# Patient Record
Sex: Female | Born: 1965 | Race: White | Hispanic: No | Marital: Married | Smoking: Former smoker
Health system: Southern US, Community
[De-identification: ages and names within clinical notes are randomized; demographics above are authoritative.]

## PROBLEM LIST (undated history)

## (undated) DIAGNOSIS — M719 Bursopathy, unspecified: Secondary | ICD-10-CM

## (undated) DIAGNOSIS — J45909 Unspecified asthma, uncomplicated: Secondary | ICD-10-CM

## (undated) DIAGNOSIS — E119 Type 2 diabetes mellitus without complications: Secondary | ICD-10-CM

## (undated) HISTORY — PX: OOPHORECTOMY: SHX86

## (undated) HISTORY — PX: KNEE ARTHROSCOPY: SUR90

---

## 2007-04-23 ENCOUNTER — Emergency Department: Payer: Self-pay | Admitting: Emergency Medicine

## 2007-06-07 ENCOUNTER — Emergency Department: Payer: Self-pay | Admitting: Emergency Medicine

## 2007-10-11 ENCOUNTER — Ambulatory Visit: Payer: Self-pay | Admitting: Internal Medicine

## 2008-03-15 ENCOUNTER — Ambulatory Visit: Payer: Self-pay | Admitting: Internal Medicine

## 2008-03-17 ENCOUNTER — Other Ambulatory Visit: Payer: Self-pay

## 2008-03-17 ENCOUNTER — Emergency Department: Payer: Self-pay | Admitting: Emergency Medicine

## 2008-04-04 ENCOUNTER — Ambulatory Visit: Payer: Self-pay | Admitting: Unknown Physician Specialty

## 2008-04-13 ENCOUNTER — Ambulatory Visit: Payer: Self-pay | Admitting: Unknown Physician Specialty

## 2008-04-14 ENCOUNTER — Other Ambulatory Visit: Payer: Self-pay

## 2008-04-15 ENCOUNTER — Observation Stay: Payer: Self-pay | Admitting: Unknown Physician Specialty

## 2008-09-04 ENCOUNTER — Emergency Department: Payer: Self-pay | Admitting: Unknown Physician Specialty

## 2008-09-26 ENCOUNTER — Ambulatory Visit: Payer: Self-pay | Admitting: Specialist

## 2008-11-28 ENCOUNTER — Ambulatory Visit: Payer: Self-pay | Admitting: Specialist

## 2008-12-05 ENCOUNTER — Ambulatory Visit: Payer: Self-pay | Admitting: Specialist

## 2008-12-19 ENCOUNTER — Ambulatory Visit: Payer: Self-pay | Admitting: Internal Medicine

## 2009-02-13 ENCOUNTER — Ambulatory Visit: Payer: Self-pay | Admitting: Family Medicine

## 2009-03-01 ENCOUNTER — Emergency Department: Payer: Self-pay | Admitting: Emergency Medicine

## 2009-05-28 ENCOUNTER — Ambulatory Visit: Payer: Self-pay | Admitting: Unknown Physician Specialty

## 2009-06-14 ENCOUNTER — Emergency Department: Payer: Self-pay | Admitting: Unknown Physician Specialty

## 2009-11-27 ENCOUNTER — Emergency Department: Payer: Self-pay | Admitting: Internal Medicine

## 2010-01-06 ENCOUNTER — Emergency Department: Payer: Self-pay | Admitting: Unknown Physician Specialty

## 2010-11-17 HISTORY — PX: OTHER SURGICAL HISTORY: SHX169

## 2011-01-14 ENCOUNTER — Emergency Department: Payer: Self-pay | Admitting: Emergency Medicine

## 2011-08-06 ENCOUNTER — Emergency Department: Payer: Self-pay | Admitting: Emergency Medicine

## 2012-03-26 ENCOUNTER — Emergency Department: Payer: Self-pay | Admitting: Emergency Medicine

## 2012-03-26 LAB — BASIC METABOLIC PANEL
Anion Gap: 6 — ABNORMAL LOW (ref 7–16)
Calcium, Total: 9.2 mg/dL (ref 8.5–10.1)
Co2: 26 mmol/L (ref 21–32)
Creatinine: 0.83 mg/dL (ref 0.60–1.30)
EGFR (African American): >60
EGFR (Non-African Amer.): >60
Osmolality: 276 (ref 275–301)
Potassium: 4.1 mmol/L (ref 3.5–5.1)
Sodium: 138 mmol/L (ref 136–145)

## 2012-03-26 LAB — URINALYSIS, COMPLETE
Bilirubin,UR: NEGATIVE
Glucose,UR: NEGATIVE mg/dL (ref 0–75)
Ph: 5 (ref 4.5–8.0)
Protein: 30
RBC,UR: 10 /HPF (ref 0–5)
Specific Gravity: 1.011 (ref 1.003–1.030)
WBC UR: 176 /HPF (ref 0–5)

## 2012-03-26 LAB — CBC
HGB: 12.1 g/dL (ref 12.0–16.0)
MCH: 27.5 pg (ref 26.0–34.0)
MCHC: 33.3 g/dL (ref 32.0–36.0)
MCV: 83 fL (ref 80–100)
RDW: 14.7 % — ABNORMAL HIGH (ref 11.5–14.5)
WBC: 13.2 10*3/uL — ABNORMAL HIGH (ref 3.6–11.0)

## 2012-03-31 LAB — URINE CULTURE

## 2013-05-27 ENCOUNTER — Emergency Department: Payer: Self-pay | Admitting: Emergency Medicine

## 2013-05-27 LAB — CBC
HGB: 12.1 g/dL (ref 12.0–16.0)
MCH: 27.4 pg (ref 26.0–34.0)
MCHC: 32.9 g/dL (ref 32.0–36.0)
MCV: 83 fL (ref 80–100)
RDW: 14.3 % (ref 11.5–14.5)
WBC: 12.7 10*3/uL — ABNORMAL HIGH (ref 3.6–11.0)

## 2013-05-27 LAB — COMPREHENSIVE METABOLIC PANEL
Albumin: 3.6 g/dL (ref 3.4–5.0)
Alkaline Phosphatase: 80 U/L (ref 50–136)
Anion Gap: 4 — ABNORMAL LOW (ref 7–16)
Calcium, Total: 9.3 mg/dL (ref 8.5–10.1)
EGFR (African American): >60
EGFR (Non-African Amer.): >60
Glucose: 107 mg/dL — ABNORMAL HIGH (ref 65–99)
Osmolality: 280 (ref 275–301)
Potassium: 3.3 mmol/L — ABNORMAL LOW (ref 3.5–5.1)
SGOT(AST): 18 U/L (ref 15–37)
SGPT (ALT): 19 U/L (ref 12–78)
Sodium: 140 mmol/L (ref 136–145)
Total Protein: 7.9 g/dL (ref 6.4–8.2)

## 2013-05-27 LAB — TROPONIN I: Troponin-I: <0.02 ng/mL

## 2014-10-27 ENCOUNTER — Emergency Department: Payer: Self-pay | Admitting: Emergency Medicine

## 2014-11-29 ENCOUNTER — Emergency Department: Payer: Self-pay | Admitting: Emergency Medicine

## 2014-11-29 LAB — CBC
HCT: 40.9 % (ref 35.0–47.0)
HGB: 13.1 g/dL (ref 12.0–16.0)
MCH: 26.7 pg (ref 26.0–34.0)
MCHC: 31.9 g/dL — AB (ref 32.0–36.0)
MCV: 84 fL (ref 80–100)
PLATELETS: 310 10*3/uL (ref 150–440)
RBC: 4.89 10*6/uL (ref 3.80–5.20)
RDW: 13.6 % (ref 11.5–14.5)
WBC: 16.3 10*3/uL — ABNORMAL HIGH (ref 3.6–11.0)

## 2014-11-29 LAB — URINALYSIS, COMPLETE
Bilirubin,UR: NEGATIVE
Glucose,UR: NEGATIVE mg/dL (ref 0–75)
Ketone: NEGATIVE
LEUKOCYTE ESTERASE: NEGATIVE
NITRITE: NEGATIVE
PH: 5 (ref 4.5–8.0)
PROTEIN: NEGATIVE
Specific Gravity: 1.033 (ref 1.003–1.030)
Squamous Epithelial: 1
WBC UR: 3 /HPF (ref 0–5)

## 2014-11-29 LAB — COMPREHENSIVE METABOLIC PANEL
ALT: 25 U/L
ANION GAP: 7 (ref 7–16)
Albumin: 3.8 g/dL (ref 3.4–5.0)
Alkaline Phosphatase: 74 U/L
BUN: 20 mg/dL — ABNORMAL HIGH (ref 7–18)
Bilirubin,Total: 0.5 mg/dL (ref 0.2–1.0)
CALCIUM: 9.1 mg/dL (ref 8.5–10.1)
CHLORIDE: 104 mmol/L (ref 98–107)
CO2: 27 mmol/L (ref 21–32)
Creatinine: 1.03 mg/dL (ref 0.60–1.30)
EGFR (African American): >60
EGFR (Non-African Amer.): >60
Glucose: 134 mg/dL — ABNORMAL HIGH (ref 65–99)
Osmolality: 280 (ref 275–301)
Potassium: 4 mmol/L (ref 3.5–5.1)
SGOT(AST): 19 U/L (ref 15–37)
Sodium: 138 mmol/L (ref 136–145)
Total Protein: 8.6 g/dL — ABNORMAL HIGH (ref 6.4–8.2)

## 2015-03-30 ENCOUNTER — Emergency Department: Payer: BLUE CROSS/BLUE SHIELD

## 2015-03-30 ENCOUNTER — Emergency Department
Admission: EM | Admit: 2015-03-30 | Discharge: 2015-03-30 | Disposition: A | Discharge status: Home / Self Care | Payer: BLUE CROSS/BLUE SHIELD | Attending: Emergency Medicine | Admitting: Emergency Medicine

## 2015-03-30 ENCOUNTER — Encounter: Payer: Self-pay | Admitting: Emergency Medicine

## 2015-03-30 DIAGNOSIS — M25562 Pain in left knee: Secondary | ICD-10-CM | POA: Diagnosis not present

## 2015-03-30 HISTORY — DX: Unspecified asthma, uncomplicated: J45.909

## 2015-03-30 MED ORDER — OXYCODONE-ACETAMINOPHEN 5-325 MG PO TABS
1.0000 | ORAL_TABLET | Freq: Once | ORAL | Status: AC
  Administered 2015-03-30: 1 via ORAL

## 2015-03-30 MED ORDER — IBUPROFEN 800 MG PO TABS: 800.0000 mg | ORAL_TABLET | Freq: Three times a day (TID) | ORAL | Status: DC | PRN

## 2015-03-30 MED ORDER — OXYCODONE-ACETAMINOPHEN 5-325 MG PO TABS
ORAL_TABLET | ORAL | Status: AC
  Administered 2015-03-30: 1 via ORAL
  Filled 2015-03-30: qty 1

## 2015-03-30 MED ORDER — OXYCODONE-ACETAMINOPHEN 5-325 MG PO TABS: 1.0000 | ORAL_TABLET | Freq: Three times a day (TID) | ORAL | Status: DC | PRN

## 2015-03-30 NOTE — ED Notes (Signed)
Patient c/o left knee and left thigh pain for several days

## 2015-03-30 NOTE — Discharge Instructions (Signed)
Take medications as prescribed. Rest knee. Apply ice. Use knee immobilizer and crutches as long as pain continues.  Follow-up with orthopedic next week as discussed. See above to call and schedule.  Return to the ER for new or worsening concerns  Knee Pain The knee is the complex joint between your thigh and your lower leg. It is made up of bones, tendons, ligaments, and cartilage. The bones that make up the knee are:  The femur in the thigh.  The tibia and fibula in the lower leg.  The patella or kneecap riding in the groove on the lower femur. CAUSES  Knee pain is a common complaint with many causes. A few of these causes are:  Injury, such as:  A ruptured ligament or tendon injury.  Torn cartilage.  Medical conditions, such as:  Gout  Arthritis  Infections  Overuse, over training, or overdoing a physical activity. Knee pain can be minor or severe. Knee pain can accompany debilitating injury. Minor knee problems often respond well to self-care measures or get well on their own. More serious injuries may need medical intervention or even surgery. SYMPTOMS The knee is complex. Symptoms of knee problems can vary widely. Some of the problems are:  Pain with movement and weight bearing.  Swelling and tenderness.  Buckling of the knee.  Inability to straighten or extend your knee.  Your knee locks and you cannot straighten it.  Warmth and redness with pain and fever.  Deformity or dislocation of the kneecap. DIAGNOSIS  Determining what is wrong may be very straight forward such as when there is an injury. It can also be challenging because of the complexity of the knee. Tests to make a diagnosis may include:  Your caregiver taking a history and doing a physical exam.  Routine X-rays can be used to rule out other problems. X-rays will not reveal a cartilage tear. Some injuries of the knee can be diagnosed by:  Arthroscopy a surgical technique by which a small video  camera is inserted through tiny incisions on the sides of the knee. This procedure is used to examine and repair internal knee joint problems. Tiny instruments can be used during arthroscopy to repair the torn knee cartilage (meniscus).  Arthrography is a radiology technique. A contrast liquid is directly injected into the knee joint. Internal structures of the knee joint then become visible on X-ray film.  An MRI scan is a non X-ray radiology procedure in which magnetic fields and a computer produce two- or three-dimensional images of the inside of the knee. Cartilage tears are often visible using an MRI scanner. MRI scans have largely replaced arthrography in diagnosing cartilage tears of the knee.  Blood work.  Examination of the fluid that helps to lubricate the knee joint (synovial fluid). This is done by taking a sample out using a needle and a syringe. TREATMENT The treatment of knee problems depends on the cause. Some of these treatments are:  Depending on the injury, proper casting, splinting, surgery, or physical therapy care will be needed.  Give yourself adequate recovery time. Do not overuse your joints. If you begin to get sore during workout routines, back off. Slow down or do fewer repetitions.  For repetitive activities such as cycling or running, maintain your strength and nutrition.  Alternate muscle groups. For example, if you are a weight lifter, work the upper body on one day and the lower body the next.  Either tight or weak muscles do not give the proper  support for your knee. Tight or weak muscles do not absorb the stress placed on the knee joint. Keep the muscles surrounding the knee strong.  Take care of mechanical problems.  If you have flat feet, orthotics or special shoes may help. See your caregiver if you need help.  Arch supports, sometimes with wedges on the inner or outer aspect of the heel, can help. These can shift pressure away from the side of the knee  most bothered by osteoarthritis.  A brace called an "unloader" brace also may be used to help ease the pressure on the most arthritic side of the knee.  If your caregiver has prescribed crutches, braces, wraps or ice, use as directed. The acronym for this is PRICE. This means protection, rest, ice, compression, and elevation.  Nonsteroidal anti-inflammatory drugs (NSAIDs), can help relieve pain. But if taken immediately after an injury, they may actually increase swelling. Take NSAIDs with food in your stomach. Stop them if you develop stomach problems. Do not take these if you have a history of ulcers, stomach pain, or bleeding from the bowel. Do not take without your caregiver's approval if you have problems with fluid retention, heart failure, or kidney problems.  For ongoing knee problems, physical therapy may be helpful.  Glucosamine and chondroitin are over-the-counter dietary supplements. Both may help relieve the pain of osteoarthritis in the knee. These medicines are different from the usual anti-inflammatory drugs. Glucosamine may decrease the rate of cartilage destruction.  Injections of a corticosteroid drug into your knee joint may help reduce the symptoms of an arthritis flare-up. They may provide pain relief that lasts a few months. You may have to wait a few months between injections. The injections do have a small increased risk of infection, water retention, and elevated blood sugar levels.  Hyaluronic acid injected into damaged joints may ease pain and provide lubrication. These injections may work by reducing inflammation. A series of shots may give relief for as long as 6 months.  Topical painkillers. Applying certain ointments to your skin may help relieve the pain and stiffness of osteoarthritis. Ask your pharmacist for suggestions. Many over the-counter products are approved for temporary relief of arthritis pain.  In some countries, doctors often prescribe topical NSAIDs  for relief of chronic conditions such as arthritis and tendinitis. A review of treatment with NSAID creams found that they worked as well as oral medications but without the serious side effects. PREVENTION  Maintain a healthy weight. Extra pounds put more strain on your joints.  Get strong, stay limber. Weak muscles are a common cause of knee injuries. Stretching is important. Include flexibility exercises in your workouts.  Be smart about exercise. If you have osteoarthritis, chronic knee pain or recurring injuries, you may need to change the way you exercise. This does not mean you have to stop being active. If your knees ache after jogging or playing basketball, consider switching to swimming, water aerobics, or other low-impact activities, at least for a few days a week. Sometimes limiting high-impact activities will provide relief.  Make sure your shoes fit well. Choose footwear that is right for your sport.  Protect your knees. Use the proper gear for knee-sensitive activities. Use kneepads when playing volleyball or laying carpet. Buckle your seat belt every time you drive. Most shattered kneecaps occur in car accidents.  Rest when you are tired. SEEK MEDICAL CARE IF:  You have knee pain that is continual and does not seem to be getting better.  SEEK IMMEDIATE MEDICAL CARE IF:  Your knee joint feels hot to the touch and you have a high fever. MAKE SURE YOU:   Understand these instructions.  Will watch your condition.  Will get help right away if you are not doing well or get worse. Document Released: 08/31/2007 Document Revised: 01/26/2012 Document Reviewed: 08/31/2007 [facility] Patient Information 2015 [place], [place]. This information is not intended to replace advice given to you by your health care provider. Make sure you discuss any questions you have with your health care provider.

## 2015-03-30 NOTE — ED Provider Notes (Signed)
[facility] Emergency Department Provider Note  ____________________________________________  Time seen: Approximately 11:36 AM  I have reviewed the triage vital signs and the nursing notes.   HISTORY  Chief Complaint Leg Pain   HPI Haley Keller is a 49 y.o. female presents to the ER with spouse at bedside. Patient presents to complaints of left leg pain. Patient states that she has had intermittent left knee pain and swelling since 2010. Patient states at that time she had outpatient knee arthroscopy but was told only had arthritis. Patient states that her pain intermittently "flares up". However patient reports times approximately 4-5 days has had increased pain not resolving with over-the-counter ibuprofen. Patient states the pain is also intermittently in calf and upper thigh which is not her normal pain.  Patient describes the pain at 4 out of 10 at rest and 7-8 out of 10 with movement or ambulating. Describes pain as aching with intermittent sharp pain.   Denies fall or known injury. Denies fever, redness, shortness of breath, chest pain, abdominal pain, dysuria, or other pain.   Past Medical History  Diagnosis Date  . Asthma     There are no active problems to display for this patient.   Past Surgical History  Procedure Laterality Date  . Oophorectomy Left   . Knee arthroscopy Left     Home medications: Albuterol inhaler as needed Allergies Review of patient's allergies indicates no known allergies.  No family history on file.  Social History History  Substance Use Topics  . Smoking status: Never Smoker   . Smokeless tobacco: Not on file  . Alcohol Use: No    Review of Systems Constitutional: No fever/chills Eyes: No visual changes. ENT: No sore throat. Cardiovascular: Denies chest pain. Respiratory: Denies shortness of breath. Gastrointestinal: No abdominal pain.  No nausea, no vomiting.  No diarrhea.  No  constipation. Genitourinary: Negative for dysuria. Musculoskeletal: Positive for left knee pain as above. Negative for neck or back pain. Skin: Negative for rash. Neurological: Negative for headaches, focal weakness or numbness.  10-point ROS otherwise negative.  ____________________________________________   PHYSICAL EXAM:  VITAL SIGNS: ED Triage Vitals  Enc Vitals Group     BP 03/30/15 1008 159/71 mmHg     Pulse Rate 03/30/15 1008 57     Resp 03/30/15 1008 15     Temp 03/30/15 1008 97.8 F (36.6 C)     Temp Source 03/30/15 1008 Oral     SpO2 03/30/15 1008 99 %     Weight 03/30/15 1008 250 lb (113.399 kg)     Height 03/30/15 1008 5\' 2"  (1.575 m)     Head Cir --      Peak Flow --      Pain Score 03/30/15 1007 7     Pain Loc --      Pain Edu? --      Excl. in GC? --     Constitutional: Alert and oriented. Well appearing and in no acute distress. Eyes: Conjunctivae are normal.  Head: Atraumatic. Nose: No congestion/rhinnorhea. Mouth/Throat: Mucous membranes are moist.   Neck: No stridor.  No cervical spine tenderness to palpation. Hematological/Lymphatic/Immunilogical: No cervical lymphadenopathy. Cardiovascular: Normal rate, regular rhythm. Grossly normal heart sounds.  Good peripheral circulation. Respiratory: Normal respiratory effort.  No retractions. Lungs CTAB. Gastrointestinal: Soft and nontender. Obese abdomen Musculoskeletal: No lower extremity tenderness nor edema.  No joint effusions.' Except: Left anterior and left lateral knee moderate tender to palpation. Pain increases with posterior  drawer test and pain with medial stress. Mild swelling to knee laterally. Skin intact, no erythema, ecchymosis. Full ROM present.  Mild tender to palpation left posterior calf, mild tender to palpation left posterior thigh soft tissue. Negative Homans sign.. Distal pedal pulses equal bilaterally and easily found. No distal swelling to bilateral lower extremities. No signs of  ischemia. No erythema. Neurologic:  Normal speech and language. No gross focal neurologic deficits are appreciated. Speech is normal. No gait instability. Skin:  Skin is warm, dry and intact. No rash noted. Psychiatric: Mood and affect are normal. Speech and behavior are normal.  ____________________________________________   ___________________________________  RADIOLOGY  LEFT LOWER EXTREMITY VENOUS DUPLEX ULTRASOUND  TECHNIQUE: Gray-scale sonography with graded compression, as well as color Doppler and duplex ultrasound were performed to evaluate the left lower extremity deep venous system from the level of the common femoral vein and including the common femoral, femoral, profunda femoral, popliteal and calf veins including the posterior tibial, peroneal and gastrocnemius veins when visible. The superficial great saphenous vein was also interrogated. Spectral Doppler was utilized to evaluate flow at rest and with distal augmentation maneuvers in the common femoral, femoral and popliteal veins.  COMPARISON: None.  FINDINGS: Contralateral Common Femoral Vein: Respiratory phasicity is normal and symmetric with the symptomatic side. No evidence of thrombus. Normal compressibility.  Common Femoral Vein: No evidence of thrombus. Normal compressibility, respiratory phasicity and response to augmentation.  Saphenofemoral Junction: No evidence of thrombus. Normal compressibility and flow on color Doppler imaging.  Profunda Femoral Vein: No evidence of thrombus. Normal compressibility and flow on color Doppler imaging.  Femoral Vein: No evidence of thrombus. Normal compressibility, respiratory phasicity and response to augmentation.  Popliteal Vein: No evidence of thrombus. Normal compressibility, respiratory phasicity and response to augmentation.  Calf Veins: No evidence of thrombus. Normal compressibility and flow on color Doppler imaging.  Superficial Great Saphenous  Vein: No evidence of thrombus. Normal compressibility and flow on color Doppler imaging.  Venous Reflux: None.  Other Findings: None.  IMPRESSION: No evidence of left lower extremity deep venous thrombosis. The right common femoral vein is also patent.   Electronically Signed By: Bretta BangWilliam Woodruff III M.D. On: 03/30/2015 12:50  LEFT KNEE - COMPLETE 4+ VIEW  COMPARISON: None.  FINDINGS: There is no evidence of fracture, dislocation, or joint effusion. There is no evidence of arthropathy or other focal bone abnormality. Soft tissues are unremarkable.  IMPRESSION: Negative left knee radiographs.   Electronically Signed By: Marin Robertshristopher Mattern M.D. On: 03/30/2015 12:45 ____________________________________________   PROCEDURES  Procedure(s) performed: SPLINT APPLICATION Date/Time: 2:13 PM Authorized by: Renford DillsLindsey Farrin Shadle Consent: Verbal consent obtained. Risks and benefits: risks, benefits and alternatives were discussed Consent given by: patient Splint applied by: ed technician Location details: left knee  Splint type: soft immobilizer  Post-procedure: The splinted body part was neurovascularly unchanged following the procedure. Patient tolerance: Patient tolerated the procedure well with no immediate complications.  crutches ____________________________________________   INITIAL IMPRESSION / ASSESSMENT AND PLAN / ED COURSE  Pertinent labs & imaging results that were available during my care of the patient were reviewed by me and considered in my medical decision making (see chart for details).  Well-appearing. No acute distress. Complains of left lateral knee pain. History of similar. Denies new injury or fall. However reports pain worse times several days. X-ray negative for acute changes. Venous ultrasound negative for deep venous thrombosis. Concern for internal injury.  Discussed with patient and spouse need to follow up with orthopedics For  further evaluation. Left knee immobilizer, crutches, rest, apply ice. When necessary ibuprofen and Percocet. Patient to follow-up with orthopedics. Patient and spouse agree to plan.   ____________________________________________   FINAL CLINICAL IMPRESSION(S) / ED DIAGNOSES  Final diagnoses:  Left knee pain      Renford Dills, NP 03/30/15 1415  Minna Antis, MD 04/01/15 1213

## 2015-06-02 ENCOUNTER — Encounter: Payer: Self-pay | Admitting: Emergency Medicine

## 2015-06-02 ENCOUNTER — Emergency Department
Admission: EM | Admit: 2015-06-02 | Discharge: 2015-06-02 | Disposition: A | Discharge status: Home / Self Care | Payer: BLUE CROSS/BLUE SHIELD | Attending: Emergency Medicine | Admitting: Emergency Medicine

## 2015-06-02 ENCOUNTER — Emergency Department: Payer: BLUE CROSS/BLUE SHIELD

## 2015-06-02 DIAGNOSIS — Z87891 Personal history of nicotine dependence: Secondary | ICD-10-CM | POA: Insufficient documentation

## 2015-06-02 DIAGNOSIS — M62830 Muscle spasm of back: Secondary | ICD-10-CM | POA: Diagnosis not present

## 2015-06-02 DIAGNOSIS — M545 Low back pain: Secondary | ICD-10-CM | POA: Diagnosis present

## 2015-06-02 MED ORDER — KETOROLAC TROMETHAMINE 30 MG/ML IJ SOLN
30.0000 mg | Freq: Once | INTRAMUSCULAR | Status: AC
  Administered 2015-06-02: 30 mg via INTRAMUSCULAR
  Filled 2015-06-02: qty 1

## 2015-06-02 MED ORDER — IBUPROFEN 800 MG PO TABS: 800.0000 mg | ORAL_TABLET | Freq: Three times a day (TID) | ORAL | Status: DC | PRN

## 2015-06-02 MED ORDER — CYCLOBENZAPRINE HCL 10 MG PO TABS: 10.0000 mg | ORAL_TABLET | Freq: Every day | ORAL | Status: DC

## 2015-06-02 MED ORDER — ORPHENADRINE CITRATE 30 MG/ML IJ SOLN
60.0000 mg | Freq: Once | INTRAMUSCULAR | Status: AC
  Administered 2015-06-02: 60 mg via INTRAMUSCULAR
  Filled 2015-06-02: qty 2

## 2015-06-02 NOTE — ED Provider Notes (Signed)
CSN: [id number]     Arrival date & time 06/02/15  0808 History   First MD Initiated Contact with Patient 06/02/15 [phone]     Chief Complaint  Patient presents with  . Back Pain    HPI Comments: 49 year old female presents today complaining of mid back pain that started when she woke up this morning. For the past couple weeks she has had some pain in her lower back radiating down her right leg. It is associated with tingling and numbness in her right leg. Denies loss of bowel or bladder function, perianal or genital numbness. She takes Motrin at times without relief. She does not have a history of back problems. Denies any chest or abdominal pain associated with her symptoms. No dysuria, nausea or vomiting.  Patient is a 49 y.o. female presenting with back pain. The history is provided by the patient.  Back Pain Location:  Thoracic spine Quality:  Stabbing Radiates to:  Does not radiate Pain severity:  Severe Pain is:  Same all the time Onset quality:  Sudden Duration:  2 hours Timing:  Constant Progression:  Unchanged Chronicity:  Recurrent Context: not jumping from heights, not lifting heavy objects, not occupational injury, not physical stress and not recent injury   Relieved by:  None tried Worsened by:  Ambulation, deep breathing, movement, standing, palpation and lying down Ineffective treatments:  None tried Associated symptoms: dysuria, leg pain, numbness and paresthesias   Associated symptoms: no abdominal pain, no bladder incontinence, no bowel incontinence, no chest pain, no fever, no headaches, no perianal numbness, no tingling and no weakness   Risk factors: lack of exercise and obesity     Past Medical History  Diagnosis Date  . Asthma    Past Surgical History  Procedure Laterality Date  . Oophorectomy Left   . Knee arthroscopy Left   . Right wristy surgery  2012   No family history on file. History  Substance Use Topics  . Smoking status: Former Games developer  .  Smokeless tobacco: Never Used  . Alcohol Use: No   OB History    No data available     Review of Systems  Constitutional: Negative for fever and chills.  Cardiovascular: Negative for chest pain.  Gastrointestinal: Negative for nausea, vomiting, abdominal pain and bowel incontinence.  Genitourinary: Positive for dysuria. Negative for bladder incontinence.  Musculoskeletal: Positive for myalgias, back pain, arthralgias and neck pain. Negative for gait problem and neck stiffness.  Neurological: Positive for numbness and paresthesias. Negative for tingling, weakness and headaches.  All other systems reviewed and are negative.     Allergies  Review of patient's allergies indicates no known allergies.  Home Medications   Prior to Admission medications   Medication Sig Start Date End Date Taking? Authorizing Provider  cyclobenzaprine (FLEXERIL) 10 MG tablet Take 1 tablet (10 mg total) by mouth at bedtime. 06/02/15 06/01/16  Luvenia Redden, PA-C  ibuprofen (ADVIL,MOTRIN) 800 MG tablet Take 1 tablet (800 mg total) by mouth every 8 (eight) hours as needed. 06/02/15   Wilber Oliphant V, PA-C   BP 144/62 mmHg  Pulse 74  Temp(Src) 97.6 F (36.4 C) (Oral)  Resp 22  Ht 5\' 2"  (1.575 m)  Wt 253 lb (114.76 kg)  BMI 46.26 kg/m2  SpO2 98% Physical Exam  Constitutional: She is oriented to person, place, and time. Vital signs are normal. She appears well-developed and well-nourished. She does not have a sickly appearance. She does not appear ill. She appears distressed.  Morbidly obese, in mild distress due to pain  HENT:  Head: Normocephalic and atraumatic.  Neck: Normal range of motion and full passive range of motion without pain. Neck supple. Spinous process tenderness and muscular tenderness present. Normal range of motion present.  Diffuse cervical spine tenderness Bilateral trapezius with tenderness to palpation  Cardiovascular: Normal rate, regular rhythm, S1 normal, S2 normal, normal heart  sounds and intact distal pulses.  Exam reveals no gallop.   No murmur heard. Pulses:      Radial pulses are 2+ on the right side, and 2+ on the left side.       Posterior tibial pulses are 2+ on the right side, and 2+ on the left side.  Pulmonary/Chest: Effort normal and breath sounds normal. No respiratory distress. She has no wheezes. She has no rales.  Musculoskeletal: She exhibits tenderness.       Right hip: Normal. She exhibits normal range of motion, normal strength and no tenderness.       Left hip: Normal. She exhibits normal range of motion, normal strength and no tenderness.       Thoracic back: She exhibits tenderness and bony tenderness. She exhibits normal range of motion.       Lumbar back: She exhibits tenderness and bony tenderness. She exhibits normal range of motion.  Diffuse thoracic spine tenderness Patient is tender even to light palpation of bilateral paraspinal thoracic muscles. Spasm noted to both sides Tenderness to lower back and bilateral paraspinal lumbar muscles  Neurological: She is alert and oriented to person, place, and time. She has normal strength. No sensory deficit. She exhibits normal muscle tone. Gait normal.  Strength 5 out of 5 bilateral lower extremities. Grip strength 5 out of 5 bilateral upper extremities Negative straight-leg raise bilaterally  Skin: Skin is warm and dry.  Psychiatric: She has a normal mood and affect. Her behavior is normal. Judgment and thought content normal.  Nursing note and vitals reviewed.   ED Course  Procedures (including critical care time) Labs Review Labs Reviewed - No data to display  Imaging Review Dg Cervical Spine Complete  06/02/2015   CLINICAL DATA:  49 year old female with a history of neck pain  EXAM: CERVICAL SPINE  4+ VIEWS  COMPARISON:  None.  FINDINGS: Cervical Spine:  Cervical elements from the level of the C1-T1 maintain alignment, without subluxation, anterolisthesis, retrolisthesis. Reversal of  cervical lordosis.  No acute fracture line identified.  Unremarkable appearance of the craniocervical junction.  Vertebral body heights maintained as well as disc space heights.  No significant degenerative disc disease or endplate changes.  No significant facet disease.  Prevertebral soft tissues within normal limits.  Oblique images demonstrate no significant neural foraminal encroachment. There is mild foraminal disease on the left and right at C3-C4.  IMPRESSION: No radiographic evidence of acute fracture or malalignment of the cervical spine.  Reversal of cervical lordosis may be positional.  Signed,  Yvone Neu. Loreta Ave, DO  [facility]  [facility]   Electronically Signed   By: Gilmer Mor D.O.   On: 06/02/2015 09:14   Dg Thoracic Spine 2 View  06/02/2015   CLINICAL DATA:  Patient has neck pain and mid back pain that radiates laterally to both sides of her back with no injury. Patient states she was fine yesterday and woke up in pain. Patient is having difficulty moving.  EXAM: THORACIC SPINE - 2-3 VIEWS  COMPARISON:  None.  FINDINGS: No fracture. No spondylolisthesis.  Minor endplate spurring is noted along the mid to lower thoracic spine. Soft tissues are unremarkable.  IMPRESSION: No fracture or acute finding.  Minor disc degenerative change.   Electronically Signed   By: Amie Portlandavid  Ormond M.D.   On: 06/02/2015 09:12     EKG Interpretation None      MDM  I independently reviewed and interpreted the lumbar spine and thoracic spine x-rays -there is no evidence of fracture, scoliosis or significant digenerative change Norflex 60 mg IM, Toradol 30 mg IM in ER Instructed pt to take motrin TID with food, flexeril at night time - drowsiness warning given Moist heat to back and gentle stretching - follow up with PCP or persistent symptoms, return here if worsening  Final diagnoses:  Spasm of back muscles        Luvenia ReddenEmma Weavil V, PA-C 06/02/15  0917  Jene Everyobert Kinner, MD 06/02/15 [phone]

## 2015-06-02 NOTE — ED Notes (Addendum)
Pt reports she woke this morning with middle back pain about where her bra line is.  Denies injury. Increased pain with palpitation and movement.

## 2015-08-04 NOTE — Addendum Note (Signed)
Encounter addended by: Billiejo Sorto Weavil V, PA-C on: 08/04/2015 10:22 AM<BR>     Documentation filed: Letters

## 2015-08-13 ENCOUNTER — Encounter: Payer: Self-pay | Admitting: Emergency Medicine

## 2015-08-13 ENCOUNTER — Emergency Department
Admission: EM | Admit: 2015-08-13 | Discharge: 2015-08-13 | Disposition: A | Discharge status: Home / Self Care | Payer: BLUE CROSS/BLUE SHIELD | Attending: Emergency Medicine | Admitting: Emergency Medicine

## 2015-08-13 DIAGNOSIS — S199XXA Unspecified injury of neck, initial encounter: Secondary | ICD-10-CM | POA: Diagnosis present

## 2015-08-13 DIAGNOSIS — Y939 Activity, unspecified: Secondary | ICD-10-CM | POA: Diagnosis not present

## 2015-08-13 DIAGNOSIS — Z87891 Personal history of nicotine dependence: Secondary | ICD-10-CM | POA: Insufficient documentation

## 2015-08-13 DIAGNOSIS — Y929 Unspecified place or not applicable: Secondary | ICD-10-CM | POA: Diagnosis not present

## 2015-08-13 DIAGNOSIS — X58XXXA Exposure to other specified factors, initial encounter: Secondary | ICD-10-CM | POA: Diagnosis not present

## 2015-08-13 DIAGNOSIS — S161XXA Strain of muscle, fascia and tendon at neck level, initial encounter: Secondary | ICD-10-CM | POA: Insufficient documentation

## 2015-08-13 DIAGNOSIS — Y999 Unspecified external cause status: Secondary | ICD-10-CM | POA: Diagnosis not present

## 2015-08-13 MED ORDER — KETOROLAC TROMETHAMINE 30 MG/ML IJ SOLN
30.0000 mg | Freq: Once | INTRAMUSCULAR | Status: AC
  Administered 2015-08-13: 30 mg via INTRAVENOUS
  Filled 2015-08-13: qty 1

## 2015-08-13 MED ORDER — NAPROXEN 500 MG PO TBEC: 500.0000 mg | DELAYED_RELEASE_TABLET | Freq: Two times a day (BID) | ORAL | Status: DC

## 2015-08-13 MED ORDER — DIAZEPAM 5 MG/ML IJ SOLN
5.0000 mg | Freq: Once | INTRAMUSCULAR | Status: AC
  Administered 2015-08-13: 5 mg via INTRAVENOUS
  Filled 2015-08-13: qty 2

## 2015-08-13 MED ORDER — METHOCARBAMOL 500 MG PO TABS: 500.0000 mg | ORAL_TABLET | Freq: Four times a day (QID) | ORAL | Status: DC | PRN

## 2015-08-13 NOTE — ED Notes (Signed)
Developed neck pain about 5 pm yesterday  Denies any injury or fever

## 2015-08-13 NOTE — Discharge Instructions (Signed)

## 2015-08-13 NOTE — ED Provider Notes (Signed)
[facility] Emergency Department Provider Note  ____________________________________________  Time seen: Approximately 1:35 PM  I have reviewed the triage vital signs and the nursing notes.   HISTORY  Chief Complaint Neck Pain    HPI Haley Keller is a 49 y.o. female since for evaluation of neck pain starting yesterday about 5:00. Patient denies any known injury. States the pain just radiates down both sides of her neck lanes of increased difficulty turning side to side.   Past Medical History  Diagnosis Date  . Asthma     There are no active problems to display for this patient.   Past Surgical History  Procedure Laterality Date  . Oophorectomy Left   . Knee arthroscopy Left   . Right wristy surgery  2012    Current Outpatient Rx  Name  Route  Sig  Dispense  Refill  . methocarbamol (ROBAXIN) 500 MG tablet   Oral   Take 1 tablet (500 mg total) by mouth every 6 (six) hours as needed for muscle spasms.   30 tablet   0   . naproxen (EC NAPROSYN) 500 MG EC tablet   Oral   Take 1 tablet (500 mg total) by mouth 2 (two) times daily with a meal.   60 tablet   0     Allergies Review of patient's allergies indicates no known allergies.  History reviewed. No pertinent family history.  Social History Social History  Substance Use Topics  . Smoking status: Former Games developer  . Smokeless tobacco: Never Used  . Alcohol Use: No    Review of Systems Constitutional: No fever/chills Eyes: No visual changes. ENT: No sore throat. Cardiovascular: Denies chest pain. Respiratory: Denies shortness of breath. Gastrointestinal: No abdominal pain.  No nausea, no vomiting.  No diarrhea.  No constipation. Genitourinary: Negative for dysuria. Musculoskeletal: Positive for neck pain. Skin: Negative for rash. Neurological: Negative for headaches, focal weakness or numbness.  10-point ROS otherwise  negative.  ____________________________________________   PHYSICAL EXAM:  VITAL SIGNS: ED Triage Vitals  Enc Vitals Group     BP 08/13/15 1309 149/61 mmHg     Pulse Rate 08/13/15 1309 65     Resp 08/13/15 1309 20     Temp 08/13/15 1309 98 F (36.7 C)     Temp Source 08/13/15 1309 Oral     SpO2 08/13/15 1309 98 %     Weight 08/13/15 1309 250 lb (113.399 kg)     Height 08/13/15 1309  (1.575 m)     Head Cir --      Peak Flow --      Pain Score 08/13/15 1310 7     Pain Loc --      Pain Edu? --      Excl. in GC? --     Constitutional: Alert and oriented. Well appearing and in no acute distress. Eyes: Conjunctivae are normal. PERRL. EOMI. Head: Atraumatic. Nose: No congestion/rhinnorhea. Mouth/Throat: Mucous membranes are moist.  Oropharynx non-erythematous. Neck: No stridor.  Limited range of motion with flexion extension lateralization. No spinal tenderness. Cardiovascular: Normal rate, regular rhythm. Grossly normal heart sounds.  Good peripheral circulation. Respiratory: Normal respiratory effort.  No retractions. Lungs CTAB. Musculoskeletal: No lower extremity tenderness nor edema.  No joint effusions. Neurologic:  Normal speech and language. No gross focal neurologic deficits are appreciated. No gait instability. Skin:  Skin is warm, dry and intact. No rash noted. Psychiatric: Mood and affect are normal. Speech and behavior are normal.  ____________________________________________   LABS (all labs ordered are listed, but only abnormal results are displayed)  Labs Reviewed - No data to display ____________________________________________   RADIOLOGY   Deferred ____________________________________________   PROCEDURES  Procedure(s) performed: None  Critical Care performed: No  ____________________________________________   INITIAL IMPRESSION / ASSESSMENT AND PLAN / ED COURSE  Pertinent labs & imaging results that were available during my care of  the patient were reviewed by me and considered in my medical decision making (see chart for details).  2 cervical myofascial strain. Rx given for Robaxin and naproxen. Heat therapy and discharge instructions provided work excuse 1 day patient encouraged to follow up with PCP or return to the ER as needed. She voices no other emergency medical complaints at this time ____________________________________________   FINAL CLINICAL IMPRESSION(S) / ED DIAGNOSES  Final diagnoses:  Cervical strain, acute, initial encounter      Evangeline Dakin, PA-C 08/13/15 1534  Sharyn Creamer, MD 08/13/15 [phone]

## 2015-08-13 NOTE — ED Notes (Signed)
Pt to ed with co neck pain that started yesterday, acute onset, denies injury.  Pt with decreased ROM in neck.

## 2017-01-22 ENCOUNTER — Emergency Department
Admission: EM | Admit: 2017-01-22 | Discharge: 2017-01-22 | Disposition: A | Discharge status: Home / Self Care | Payer: BLUE CROSS/BLUE SHIELD | Attending: Emergency Medicine | Admitting: Emergency Medicine

## 2017-01-22 DIAGNOSIS — Z87891 Personal history of nicotine dependence: Secondary | ICD-10-CM | POA: Insufficient documentation

## 2017-01-22 DIAGNOSIS — M7552 Bursitis of left shoulder: Secondary | ICD-10-CM | POA: Diagnosis not present

## 2017-01-22 DIAGNOSIS — J45909 Unspecified asthma, uncomplicated: Secondary | ICD-10-CM | POA: Diagnosis not present

## 2017-01-22 DIAGNOSIS — M542 Cervicalgia: Secondary | ICD-10-CM | POA: Diagnosis present

## 2017-01-22 HISTORY — DX: Bursopathy, unspecified: M71.9

## 2017-01-22 MED ORDER — PREDNISONE 10 MG PO TABS
ORAL_TABLET | ORAL | 0 refills | Status: DC
Start: 2017-01-22 — End: 2017-09-06

## 2017-01-22 MED ORDER — DEXAMETHASONE SODIUM PHOSPHATE 10 MG/ML IJ SOLN
10.0000 mg | Freq: Once | INTRAMUSCULAR | Status: AC
  Administered 2017-01-22: 10 mg via INTRAMUSCULAR
  Filled 2017-01-22: qty 1

## 2017-01-22 NOTE — ED Notes (Signed)
See triage note  States she has had pain to left shoulder for about 3 weeks   Was dx'd with bursitis  Now states pain has increased   Having pain to neck and into shoulder  Limited movement d/t pain

## 2017-01-22 NOTE — ED Provider Notes (Signed)
[facility] Emergency Department Provider Note  ____________________________________________   First MD Initiated Contact with Patient 01/22/17 [id number]     (approximate)  I have reviewed the triage vital signs and the nursing notes.   HISTORY  Chief Complaint Neck Pain and Arm Pain    HPI Haley Keller is a 51 y.o. female  is here with complaint of left shoulder bursitis that was diagnosed 3 weeks ago by her PCP. Patient states that the pain now is into the right side of her neck and continues in her shoulder. She states that she was not placed on any medication for her bursitis. Pain is increased with range of motion. In looking over her records she was diagnosed with bursitis and apparently not placed on any anti-inflammatories because of a history of rectal bleeding.  Blood has been bright red in nature.  She states that she has not seen any blood and has never been diagnosed with a GI bleed. She rates her pain as 9/10.   Past Medical History:  Diagnosis Date  . Asthma   . Bursitis     There are no active problems to display for this patient.   Past Surgical History:  Procedure Laterality Date  . KNEE ARTHROSCOPY Left   . OOPHORECTOMY Left   . right wristy surgery  2012    Prior to Admission medications   Medication Sig Start Date End Date Taking? Authorizing Provider  predniSONE (DELTASONE) 10 MG tablet Take 6 tablets  today, on day 2 take 5 tablets, day 3 take 4 tablets, day 4 take 3 tablets, day 5 take  2 tablets and 1 tablet the last day  Start tonight 01/22/17   Tommi Rumpshonda L Rhealynn Myhre, PA-C    Allergies Patient has no known allergies.  No family history on file.  Social History Social History  Substance Use Topics  . Smoking status: Former Games developermoker  . Smokeless tobacco: Never Used  . Alcohol use No    Review of Systems Constitutional: No fever/chills Eyes: No visual changes. Cardiovascular: Denies chest pain. Respiratory: Denies  shortness of breath. Gastrointestinal: No abdominal pain.  No nausea, no vomiting.   Musculoskeletal: Positive left shoulder pain. Skin: Negative for rash. Neurological: Negative for headaches, focal weakness or numbness.  10-point ROS otherwise negative.  ____________________________________________   PHYSICAL EXAM:  VITAL SIGNS: ED Triage Vitals  Enc Vitals Group     BP 01/22/17 0742 (!) 191/80     Pulse Rate 01/22/17 0742 (!) 57     Resp 01/22/17 0741 18     Temp 01/22/17 0741 97.7 F (36.5 C)     Temp Source 01/22/17 0741 Oral     SpO2 01/22/17 0741 99 %     Weight 01/22/17 0742 267 lb (121.1 kg)     Height 01/22/17 0742 5\' 2"  (1.575 m)     Head Circumference --      Peak Flow --      Pain Score 01/22/17 0742 9     Pain Loc --      Pain Edu? --      Excl. in GC? --     Constitutional: Alert and oriented. Well appearing and in no acute distress. Eyes: Conjunctivae are normal. PERRL. EOMI. Head: Atraumatic. Nose: No congestion/rhinnorhea. Neck: No stridor.  No cervical tenderness on palpation posteriorly. Range of motion is within normal limits. Cardiovascular: Normal rate, regular rhythm. Grossly normal heart sounds.  Good peripheral circulation. Respiratory: Normal respiratory effort.  No  retractions. Lungs CTAB. Musculoskeletal: On examination of left shoulder there is no gross deformity noted. There is moderate tenderness on palpation of the AC joint area and deltoid.  Range of motion is restricted in abduction secondary to pain. No crepitus was noted. No soft tissue swelling or warmth noted.  Neurologic:  Normal speech and language. No gross focal neurologic deficits are appreciated. No gait instability. Skin:  Skin is warm, dry and intact. No ecchymosis, abrasions, erythema was noted.   ____________________________________________   LABS (all labs ordered are listed, but only abnormal results are displayed)  Labs Reviewed - No data to  display ____________________________________________   PROCEDURES  Procedure(s) performed: None  Procedures  Critical Care performed: No  ____________________________________________   INITIAL IMPRESSION / ASSESSMENT AND PLAN / ED COURSE  Pertinent labs & imaging results that were available during my care of the patient were reviewed by me and considered in my medical decision making (see chart for details).  Patient was given Decadron 10 mg IM and pain decreased wall in the emergency room. Patient stated that range of motion of her neck improved greatly. Patient was discharged on prednisone 60 mg 6 day taper. She is also to take Tylenol if needed for extra pain medication. She is follow-up with her primary care doctor if any continued problems with her shoulder. She may also use ice or moist heat to her shoulder as needed for comfort.       ____________________________________________   FINAL CLINICAL IMPRESSION(S) / ED DIAGNOSES  Final diagnoses:  Bursitis of left shoulder      NEW MEDICATIONS STARTED DURING THIS VISIT:  Discharge Medication List as of 01/22/2017  8:54 AM    START taking these medications   Details  predniSONE (DELTASONE) 10 MG tablet Take 6 tablets  today, on day 2 take 5 tablets, day 3 take 4 tablets, day 4 take 3 tablets, day 5 take  2 tablets and 1 tablet the last day  Start tonight, Print         Note:  This document was prepared using Dragon voice recognition software and may include unintentional dictation errors.    Tommi Rumps, PA-C 01/22/17 1535    Myrna Blazer, MD 01/22/17 1537

## 2017-01-22 NOTE — Discharge Instructions (Signed)
Begin taking prednisone tonight as directed starting with 6 tablets.  Taper tablets as directed for the next 6 days.  You may also take Tylenol with this medication if extra pain medication as needed. Follow-up with your primary care doctor if any continued problems or make an appointment with your orthopedist. Also use ice or moist heat to your shoulder for comfort.

## 2017-01-22 NOTE — ED Triage Notes (Signed)
Pt c/o left shoulder pain that she was dx with bursitis 3 weeks ago and is now having neck pain with it, states it is not getting any better.

## 2017-03-23 ENCOUNTER — Other Ambulatory Visit: Payer: Self-pay | Admitting: Family Medicine

## 2017-03-23 DIAGNOSIS — Z1239 Encounter for other screening for malignant neoplasm of breast: Secondary | ICD-10-CM

## 2017-04-02 ENCOUNTER — Other Ambulatory Visit: Payer: Self-pay | Admitting: Family Medicine

## 2017-04-02 DIAGNOSIS — N6322 Unspecified lump in the left breast, upper inner quadrant: Secondary | ICD-10-CM

## 2017-09-06 ENCOUNTER — Encounter: Payer: Self-pay | Admitting: Emergency Medicine

## 2017-09-06 ENCOUNTER — Emergency Department: Payer: BLUE CROSS/BLUE SHIELD

## 2017-09-06 ENCOUNTER — Emergency Department
Admission: EM | Admit: 2017-09-06 | Discharge: 2017-09-06 | Disposition: A | Discharge status: Home / Self Care | Payer: BLUE CROSS/BLUE SHIELD | Attending: Student in an Organized Health Care Education/Training Program | Admitting: Student in an Organized Health Care Education/Training Program

## 2017-09-06 DIAGNOSIS — Z79899 Other long term (current) drug therapy: Secondary | ICD-10-CM | POA: Diagnosis not present

## 2017-09-06 DIAGNOSIS — M79602 Pain in left arm: Secondary | ICD-10-CM | POA: Diagnosis not present

## 2017-09-06 DIAGNOSIS — Z7982 Long term (current) use of aspirin: Secondary | ICD-10-CM | POA: Insufficient documentation

## 2017-09-06 DIAGNOSIS — J45909 Unspecified asthma, uncomplicated: Secondary | ICD-10-CM | POA: Insufficient documentation

## 2017-09-06 DIAGNOSIS — Z87891 Personal history of nicotine dependence: Secondary | ICD-10-CM | POA: Diagnosis not present

## 2017-09-06 LAB — BASIC METABOLIC PANEL
ANION GAP: 8 (ref 5–15)
BUN: 18 mg/dL (ref 6–20)
CHLORIDE: 103 mmol/L (ref 101–111)
CO2: 25 mmol/L (ref 22–32)
Calcium: 9.4 mg/dL (ref 8.9–10.3)
Creatinine, Ser: 0.79 mg/dL (ref 0.44–1.00)
GFR calc Af Amer: >60 mL/min (ref >60)
GLUCOSE: 147 mg/dL — AB (ref 65–99)
POTASSIUM: 4.3 mmol/L (ref 3.5–5.1)
Sodium: 136 mmol/L (ref 135–145)

## 2017-09-06 LAB — CBC WITH DIFFERENTIAL/PLATELET
BASOS ABS: 0.1 10*3/uL (ref 0–0.1)
Basophils Relative: 1 %
Eosinophils Absolute: 0.2 10*3/uL (ref 0–0.7)
Eosinophils Relative: 3 %
HCT: 39.4 % (ref 35.0–47.0)
HEMOGLOBIN: 13.3 g/dL (ref 12.0–16.0)
LYMPHS ABS: 2.4 10*3/uL (ref 1.0–3.6)
LYMPHS PCT: 33 %
MCH: 28.7 pg (ref 26.0–34.0)
MCHC: 33.8 g/dL (ref 32.0–36.0)
MCV: 84.9 fL (ref 80.0–100.0)
Monocytes Absolute: 0.7 10*3/uL (ref 0.2–0.9)
Monocytes Relative: 10 %
NEUTROS ABS: 3.8 10*3/uL (ref 1.4–6.5)
NEUTROS PCT: 53 %
Platelets: 222 10*3/uL (ref 150–440)
RBC: 4.64 MIL/uL (ref 3.80–5.20)
RDW: 13.7 % (ref 11.5–14.5)
WBC: 7.2 10*3/uL (ref 3.6–11.0)

## 2017-09-06 LAB — TROPONIN I

## 2017-09-06 MED ORDER — TRAMADOL HCL 50 MG PO TABS: 50.0000 mg | ORAL_TABLET | Freq: Four times a day (QID) | ORAL | 0 refills | Status: AC | PRN

## 2017-09-06 MED ORDER — LIDOCAINE 5 % EX PTCH
1.0000 | MEDICATED_PATCH | CUTANEOUS | Status: DC
  Administered 2017-09-06: 1 via TRANSDERMAL
  Filled 2017-09-06: qty 1

## 2017-09-06 MED ORDER — LIDOCAINE 5 % EX PTCH: 1.0000 | MEDICATED_PATCH | Freq: Two times a day (BID) | CUTANEOUS | 0 refills | Status: AC

## 2017-09-06 MED ORDER — OXYCODONE-ACETAMINOPHEN 5-325 MG PO TABS
1.0000 | ORAL_TABLET | Freq: Once | ORAL | Status: AC
  Administered 2017-09-06: 1 via ORAL
  Filled 2017-09-06: qty 1

## 2017-09-06 MED ORDER — KETOROLAC TROMETHAMINE 30 MG/ML IJ SOLN
15.0000 mg | Freq: Once | INTRAMUSCULAR | Status: AC
  Administered 2017-09-06: 15 mg via INTRAVENOUS
  Filled 2017-09-06: qty 1

## 2017-09-06 MED ORDER — CYCLOBENZAPRINE HCL 10 MG PO TABS
10.0000 mg | ORAL_TABLET | Freq: Once | ORAL | Status: AC
  Administered 2017-09-06: 10 mg via ORAL
  Filled 2017-09-06: qty 1

## 2017-09-06 MED ORDER — CYCLOBENZAPRINE HCL 10 MG PO TABS: 10.0000 mg | ORAL_TABLET | Freq: Three times a day (TID) | ORAL | 0 refills | Status: AC | PRN

## 2017-09-06 NOTE — ED Triage Notes (Signed)
Pt reports left upper arm hurting yesterday; woke this am with swelling to area; denies injury; no redness or warmth noted;

## 2017-09-06 NOTE — ED Provider Notes (Signed)
[facility] Emergency Department Provider Note    None    (approximate)  I have reviewed the triage vital signs and the nursing notes.   HISTORY  Chief Complaint Arm Pain    HPI Haley Keller is a 51 y.o. female presents with chief complaint of swelling and pain to the left arm.  Patient has had issues with arthritis and bursitis in the past but woke up with severe pain in the left arm.  States that she noted some tingling in the left arm yesterday.  Denies any trauma.  Rates the pain is moderate to severe and aching sensation.  No personal history of blood clots.  No chest pain or shortness of breath.  No fevers.  Past Medical History:  Diagnosis Date  . Asthma   . Bursitis    History reviewed. No pertinent family history. Past Surgical History:  Procedure Laterality Date  . KNEE ARTHROSCOPY Left   . OOPHORECTOMY Left   . right wristy surgery  2012   There are no active problems to display for this patient.     Prior to Admission medications   Medication Sig Start Date End Date Taking? Authorizing Provider  albuterol (PROVENTIL HFA;VENTOLIN HFA) 108 (90 Base) MCG/ACT inhaler Inhale 2 puffs into the lungs every 6 (six) hours as needed for wheezing or shortness of breath.   Yes [provider]  aspirin EC 81 MG tablet Take 81 mg by mouth daily.   Yes [provider]  beclomethasone (QVAR) 80 MCG/ACT inhaler Inhale 2 puffs into the lungs 2 (two) times daily.   Yes [provider]  cyclobenzaprine (FLEXERIL) 10 MG tablet Take 1 tablet (10 mg total) by mouth 3 (three) times daily as needed for muscle spasms. 09/06/17   Willy Eddy, MD  traMADol (ULTRAM) 50 MG tablet Take 1 tablet (50 mg total) by mouth every 6 (six) hours as needed. 09/06/17 09/06/18  Willy Eddy, MD    Allergies Patient has no known allergies.    Social History Social History  Substance Use Topics  . Smoking status: Former Games developer  .  Smokeless tobacco: Never Used  . Alcohol use No    Review of Systems Patient denies headaches, rhinorrhea, blurry vision, numbness, shortness of breath, chest pain, edema, cough, abdominal pain, nausea, vomiting, diarrhea, dysuria, fevers, rashes or hallucinations unless otherwise stated above in HPI. ____________________________________________   PHYSICAL EXAM:  VITAL SIGNS: Vitals:   09/06/17 0525  BP: (!) 157/82  Pulse: 60  Resp: 19  Temp: 97.7 F (36.5 C)  SpO2: 98%    Constitutional: Alert and oriented. Well appearing and in no acute distress. Eyes: Conjunctivae are normal.  Head: Atraumatic. Nose: No congestion/rhinnorhea. Mouth/Throat: Mucous membranes are moist.   Neck: No stridor. Painless ROM.  Cardiovascular: Normal rate, regular rhythm. Grossly normal heart sounds.  Good peripheral circulation. Respiratory: Normal respiratory effort.  No retractions. Lungs CTAB. Gastrointestinal: Soft and nontender. No distention. No abdominal bruits. No CVA tenderness. Musculoskeletal: Swelling and tenderness palpation over the lateral aspect of the left upper arm.  No overlying erythema crepitus or warmth.  Neurovascularly intact distally.  There is pain with flexion and extension of the elbow but no joint effusion.  Pain seems to be muscular coming from the mid arm.  No pain of the shoulder or neck.  No lower extremity tenderness nor edema.  No joint effusions. Neurologic:  Normal speech and language. No gross focal neurologic deficits are appreciated. No facial droop Skin:  Skin is warm, dry and intact. No rash noted. Psychiatric: Mood and affect are normal. Speech and behavior are normal.  ____________________________________________   LABS (all labs ordered are listed, but only abnormal results are displayed)  Results for orders placed or performed during the hospital encounter of 09/06/17 (from the past 24 hour(s))  CBC with Differential/Platelet     Status: None    Collection Time: 09/06/17  5:50 AM  Result Value Ref Range   WBC 7.2 3.6 - 11.0 K/uL   RBC 4.64 3.80 - 5.20 MIL/uL   Hemoglobin 13.3 12.0 - 16.0 g/dL   HCT 16.139.4 09.635.0 - 04.547.0 %   MCV 84.9 80.0 - 100.0 fL   MCH 28.7 26.0 - 34.0 pg   MCHC 33.8 32.0 - 36.0 g/dL   RDW 40.913.7 81.111.5 - 91.414.5 %   Platelets 222 150 - 440 K/uL   Neutrophils Relative % 53 %   Neutro Abs 3.8 1.4 - 6.5 K/uL   Lymphocytes Relative 33 %   Lymphs Abs 2.4 1.0 - 3.6 K/uL   Monocytes Relative 10 %   Monocytes Absolute 0.7 0.2 - 0.9 K/uL   Eosinophils Relative 3 %   Eosinophils Absolute 0.2 0 - 0.7 K/uL   Basophils Relative 1 %   Basophils Absolute 0.1 0 - 0.1 K/uL  Basic metabolic panel     Status: Abnormal   Collection Time: 09/06/17  5:50 AM  Result Value Ref Range   Sodium 136 135 - 145 mmol/L   Potassium 4.3 3.5 - 5.1 mmol/L   Chloride 103 101 - 111 mmol/L   CO2 25 22 - 32 mmol/L   Glucose, Bld 147 (H) 65 - 99 mg/dL   BUN 18 6 - 20 mg/dL   Creatinine, Ser 7.820.79 0.44 - 1.00 mg/dL   Calcium 9.4 8.9 - 95.610.3 mg/dL   GFR calc non Af Amer >60 >60 mL/min   GFR calc Af Amer >60 >60 mL/min   Anion gap 8 5 - 15  Troponin I     Status: None   Collection Time: 09/06/17  5:50 AM  Result Value Ref Range   Troponin I <0.03 <0.03 ng/mL   ____________________________________________  EKG My review and personal interpretation at Time: 5:36   Indication: left arm pain  Rate: 55  Rhythm: sinus Axis: normal Other: normal intervals, no stemi ____________________________________________  RADIOLOGY  I personally reviewed all radiographic images ordered to evaluate for the above acute complaints and reviewed radiology reports and findings.  These findings were personally discussed with the patient.  Please see medical record for radiology report.  ____________________________________________   PROCEDURES  Procedure(s) performed:  Procedures    Critical Care performed:  no ____________________________________________   INITIAL IMPRESSION / ASSESSMENT AND PLAN / ED COURSE  Pertinent labs & imaging results that were available during my care of the patient were reviewed by me and considered in my medical decision making (see chart for details).  DDX: dvt, contusion, spasm, acs, strain, bursitis, fracture  Christiana PellantBarbara R Weil is a 51 y.o. who presents to the ED with left arm pain as described above.  Patient is uncomfortable appearing but in no acute distress.  She is afebrile and hemodynamically stable.  Does have slight swelling noted to the left arm without any evidence of infectious process.  Has good distal pulses therefore I do not believe this is consistent with acute embolic full phenomena or dissection.  Neurovascularly intact.  X-ray imaging ordered for the above differential shows no  evidence of fracture.  There is no evidence of DVT.  Blood work is reassuring.  She has no evidence of ACS.  Do suspect some component of muscular skeletal pain.  Pain improved in the ER. Discussed strict return precautions. Patient stable for follow-up with PCP.      ____________________________________________   FINAL CLINICAL IMPRESSION(S) / ED DIAGNOSES  Final diagnoses:  Left arm pain      NEW MEDICATIONS STARTED DURING THIS VISIT:  New Prescriptions   CYCLOBENZAPRINE (FLEXERIL) 10 MG TABLET    Take 1 tablet (10 mg total) by mouth 3 (three) times daily as needed for muscle spasms.   TRAMADOL (ULTRAM) 50 MG TABLET    Take 1 tablet (50 mg total) by mouth every 6 (six) hours as needed.     Note:  This document was prepared using Dragon voice recognition software and may include unintentional dictation errors.    Willy Eddy, MD 09/06/17 [phone]

## 2017-09-06 NOTE — ED Notes (Signed)
Pt verbalizes understanding of discharge instructions.

## 2017-11-04 ENCOUNTER — Encounter: Payer: Self-pay | Admitting: Emergency Medicine

## 2017-11-04 ENCOUNTER — Emergency Department: Payer: BLUE CROSS/BLUE SHIELD

## 2017-11-04 ENCOUNTER — Emergency Department
Admission: EM | Admit: 2017-11-04 | Discharge: 2017-11-04 | Disposition: A | Discharge status: Home / Self Care | Payer: BLUE CROSS/BLUE SHIELD | Attending: Emergency Medicine | Admitting: Emergency Medicine

## 2017-11-04 DIAGNOSIS — N393 Stress incontinence (female) (male): Secondary | ICD-10-CM | POA: Insufficient documentation

## 2017-11-04 DIAGNOSIS — J45909 Unspecified asthma, uncomplicated: Secondary | ICD-10-CM | POA: Insufficient documentation

## 2017-11-04 DIAGNOSIS — J069 Acute upper respiratory infection, unspecified: Secondary | ICD-10-CM | POA: Diagnosis present

## 2017-11-04 DIAGNOSIS — Z87891 Personal history of nicotine dependence: Secondary | ICD-10-CM | POA: Diagnosis not present

## 2017-11-04 DIAGNOSIS — B9789 Other viral agents as the cause of diseases classified elsewhere: Secondary | ICD-10-CM

## 2017-11-04 DIAGNOSIS — Z7982 Long term (current) use of aspirin: Secondary | ICD-10-CM | POA: Diagnosis not present

## 2017-11-04 DIAGNOSIS — Z79899 Other long term (current) drug therapy: Secondary | ICD-10-CM | POA: Diagnosis not present

## 2017-11-04 LAB — GROUP A STREP BY PCR: GROUP A STREP BY PCR: NOT DETECTED

## 2017-11-04 MED ORDER — DEXAMETHASONE SODIUM PHOSPHATE 10 MG/ML IJ SOLN
10.0000 mg | Freq: Once | INTRAMUSCULAR | Status: AC
  Administered 2017-11-04: 10 mg via INTRAMUSCULAR
  Filled 2017-11-04: qty 1

## 2017-11-04 MED ORDER — PREDNISONE 10 MG (21) PO TBPK: ORAL_TABLET | ORAL | 0 refills | Status: DC

## 2017-11-04 MED ORDER — ALBUTEROL SULFATE HFA 108 (90 BASE) MCG/ACT IN AERS: 2.0000 | INHALATION_SPRAY | Freq: Four times a day (QID) | RESPIRATORY_TRACT | 0 refills | Status: AC | PRN

## 2017-11-04 MED ORDER — BENZONATATE 100 MG PO CAPS: 100.0000 mg | ORAL_CAPSULE | Freq: Three times a day (TID) | ORAL | 0 refills | Status: AC | PRN

## 2017-11-04 NOTE — ED Provider Notes (Signed)
[facility] Emergency Department Provider Note ____________________________________________  Time seen: [id number]  I have reviewed the triage vital signs and the nursing notes.  HISTORY  Chief Complaint  URI  HPI Haley Keller is a 51 y.o. female presents to the ED with complaints of sore throat since Monday and cough since yesterday.  Patient reports only low-grade fevers.  She describes taking NyQuil, DayQuil, Mucinex, Tylenol, and Robitussin for her symptoms.  She denies any sick contacts, recent travel, or other exposures.  She does note some blood-tinged mucus from the nose as well as some cough-induced vomiting.  She did not receive the seasonal flu vaccine.  Past Medical History:  Diagnosis Date  . Asthma   . Bursitis     There are no active problems to display for this patient.   Past Surgical History:  Procedure Laterality Date  . KNEE ARTHROSCOPY Left   . OOPHORECTOMY Left   . right wristy surgery  2012    Prior to Admission medications   Medication Sig Start Date End Date Taking? Authorizing Provider  albuterol (PROVENTIL HFA;VENTOLIN HFA) 108 (90 Base) MCG/ACT inhaler Inhale 2 puffs into the lungs every 6 (six) hours as needed for wheezing or shortness of breath. 11/04/17   Reesa Gotschall, Charlesetta IvoryJenise V Bacon, PA-C  aspirin EC 81 MG tablet Take 81 mg by mouth daily.    [provider]  beclomethasone (QVAR) 80 MCG/ACT inhaler Inhale 2 puffs into the lungs 2 (two) times daily.    [provider]  benzonatate (TESSALON PERLES) 100 MG capsule Take 1 capsule (100 mg total) by mouth 3 (three) times daily as needed for cough (Take 1-2 per dose). 11/04/17   Sheletha Bow, Charlesetta IvoryJenise V Bacon, PA-C  cyclobenzaprine (FLEXERIL) 10 MG tablet Take 1 tablet (10 mg total) by mouth 3 (three) times daily as needed for muscle spasms. 09/06/17   Willy Eddyobinson, Patrick, MD  lidocaine (LIDODERM) 5 % Place 1 patch onto the skin every 12 (twelve) hours. Remove & Discard patch  within 12 hours or as directed by MD 09/06/17 09/06/18  Willy Eddyobinson, Patrick, MD  predniSONE (STERAPRED UNI-PAK 21 TAB) 10 MG (21) TBPK tablet 6-day taper as directed. 11/04/17   Yohann Curl, Charlesetta IvoryJenise V Bacon, PA-C  traMADol (ULTRAM) 50 MG tablet Take 1 tablet (50 mg total) by mouth every 6 (six) hours as needed. 09/06/17 09/06/18  Willy Eddyobinson, Patrick, MD    Allergies Patient has no known allergies.  No family history on file.  Social History Social History   Tobacco Use  . Smoking status: Former Games developermoker  . Smokeless tobacco: Never Used  Substance Use Topics  . Alcohol use: No  . Drug use: No    Review of Systems  Constitutional: Negative for fever. Eyes: Negative for visual changes. ENT: Positive for sore throat.  Nasal congestion as above. Cardiovascular: Negative for chest pain. Respiratory: Negative for shortness of breath.  Productive cough as noted. Gastrointestinal: Negative for abdominal pain, and diarrhea.  Notes cough-induced vomiting today. Genitourinary: Negative for dysuria.  Notes some stress incontinence. Musculoskeletal: Negative for back pain. Skin: Negative for rash. Neurological: Negative for headaches, focal weakness or numbness. ____________________________________________  PHYSICAL EXAM:  VITAL SIGNS: ED Triage Vitals  Enc Vitals Group     BP 11/04/17 1742 (!) 143/47     Pulse Rate 11/04/17 1742 68     Resp 11/04/17 1742 20     Temp 11/04/17 1742 99.1 F (37.3 C)     Temp Source 11/04/17 1742 Oral  SpO2 11/04/17 1742 97 %     Weight 11/04/17 1743 260 lb (117.9 kg)     Height 11/04/17 1743 5\' 2"  (1.575 m)     Head Circumference --      Peak Flow --      Pain Score 11/04/17 1741 10     Pain Loc --      Pain Edu? --      Excl. in GC? --     Constitutional: Alert and oriented. Well appearing and in no distress. Head: Normocephalic and atraumatic. Eyes: Conjunctivae are normal. PERRL. Normal extraocular movements Ears: Canals clear. TMs intact  bilaterally.  TMs are dull and retracted with no effusion noted. Nose: No congestion/rhinorrhea/epistaxis. Mouth/Throat: Mucous membranes are moist.  Uvula is midline and tonsils are flat.  Mild oropharyngeal erythema is appreciated.  Patient with what appears to be an early aphthous ulcer to the base of the uvula. Neck: Supple. No thyromegaly. Hematological/Lymphatic/Immunological: No cervical lymphadenopathy. Cardiovascular: Normal rate, regular rhythm. Normal distal pulses. Respiratory: Normal respiratory effort. No wheezes/rales/rhonchi.  Intermittent cough noted. Gastrointestinal: Soft and nontender. No distention. GU: Deferred ____________________________________________   LABS (pertinent positives/negatives) Labs Reviewed  GROUP A STREP BY PCR  ____________________________________________   RADIOLOGY  CXR  FINDINGS: Cardiomediastinal silhouette within normal limits. No evidence of central vascular congestion. No pneumothorax or pleural effusion. No confluent airspace disease. ____________________________________________  PROCEDURES  Procedures Decadron 10 mg IM ____________________________________________  INITIAL IMPRESSION / ASSESSMENT AND PLAN / ED COURSE  Patient with ED evaluation of cough and congestion.  Patient's exam is overall benign showing some symptoms of a viral URI likely.  A chest x-ray is reassuring and shows no acute cardiopulmonary process.  Patient is treated with an IM injection of steroid in the ED.  She is discharged at this time with a prescription for Tessalon Perles, prednisone, and albuterol.  She will follow with primary care provider or return to the ED as needed. ____________________________________________  FINAL CLINICAL IMPRESSION(S) / ED DIAGNOSES  Final diagnoses:  Viral URI with cough  Stress incontinence      Karmen StabsMenshew, Charlesetta IvoryJenise V Bacon, PA-C 11/04/17 1943    Sharman CheekStafford, Phillip, MD 11/17/17 2012

## 2017-11-04 NOTE — ED Triage Notes (Signed)
Pt in via POV with complaints of sore throat since Monday, reports worsening yesterday, coughing up green, bloody sputum.  Pt ambulatory to triage, NAD noted at this time.

## 2017-11-04 NOTE — Discharge Instructions (Signed)
Your exam and chest x-ray are negative for any pneumonia or bronchitis. Your strep test was also negative. You have a viral respiratory infection. Take the prescription meds as directed. Follow-up with your provider for continued symptoms.

## 2017-11-06 ENCOUNTER — Emergency Department
Admission: EM | Admit: 2017-11-06 | Discharge: 2017-11-06 | Disposition: A | Discharge status: Home / Self Care | Payer: BLUE CROSS/BLUE SHIELD | Attending: Emergency Medicine | Admitting: Emergency Medicine

## 2017-11-06 ENCOUNTER — Other Ambulatory Visit: Payer: Self-pay

## 2017-11-06 ENCOUNTER — Emergency Department: Payer: BLUE CROSS/BLUE SHIELD

## 2017-11-06 DIAGNOSIS — J45901 Unspecified asthma with (acute) exacerbation: Secondary | ICD-10-CM | POA: Diagnosis not present

## 2017-11-06 DIAGNOSIS — Z87891 Personal history of nicotine dependence: Secondary | ICD-10-CM | POA: Insufficient documentation

## 2017-11-06 DIAGNOSIS — F419 Anxiety disorder, unspecified: Secondary | ICD-10-CM

## 2017-11-06 DIAGNOSIS — R0602 Shortness of breath: Secondary | ICD-10-CM | POA: Diagnosis present

## 2017-11-06 MED ORDER — ALBUTEROL SULFATE (2.5 MG/3ML) 0.083% IN NEBU: 5.0000 mg | INHALATION_SOLUTION | Freq: Once | RESPIRATORY_TRACT | Status: DC

## 2017-11-06 MED ORDER — HYDROCOD POLST-CPM POLST ER 10-8 MG/5ML PO SUER: 5.0000 mL | Freq: Two times a day (BID) | ORAL | 0 refills | Status: AC

## 2017-11-06 MED ORDER — HYDROCOD POLST-CPM POLST ER 10-8 MG/5ML PO SUER
ORAL | Status: AC
  Administered 2017-11-06: 5 mL via ORAL
  Filled 2017-11-06: qty 10

## 2017-11-06 MED ORDER — HYDROCOD POLST-CPM POLST ER 10-8 MG/5ML PO SUER: 5.0000 mL | Freq: Once | ORAL | Status: DC

## 2017-11-06 MED ORDER — PREDNISONE 20 MG PO TABS
60.0000 mg | ORAL_TABLET | Freq: Once | ORAL | Status: AC
  Administered 2017-11-06: 60 mg via ORAL
  Filled 2017-11-06: qty 3

## 2017-11-06 MED ORDER — HYDROCOD POLST-CPM POLST ER 10-8 MG/5ML PO SUER
5.0000 mL | Freq: Once | ORAL | Status: AC
  Administered 2017-11-06: 5 mL via ORAL

## 2017-11-06 MED ORDER — PREDNISONE 50 MG PO TABS: ORAL_TABLET | ORAL | 0 refills | Status: DC

## 2017-11-06 MED ORDER — DIAZEPAM 5 MG PO TABS
5.0000 mg | ORAL_TABLET | Freq: Once | ORAL | Status: AC
  Administered 2017-11-06: 5 mg via ORAL
  Filled 2017-11-06: qty 1

## 2017-11-06 MED ORDER — IPRATROPIUM-ALBUTEROL 0.5-2.5 (3) MG/3ML IN SOLN
3.0000 mL | Freq: Once | RESPIRATORY_TRACT | Status: AC
  Administered 2017-11-06: 3 mL via RESPIRATORY_TRACT
  Filled 2017-11-06: qty 3

## 2017-11-06 NOTE — ED Provider Notes (Signed)
[facility] Emergency Department Provider Note       Time seen: ----------------------------------------- 7:43 PM on 11/06/2017 -----------------------------------------   I have reviewed the triage vital signs and the nursing notes.  HISTORY   Chief Complaint Shortness of Breath    HPI Haley Keller is a 51 y.o. female with a history of asthma and bursitis who presents to the ED for difficulty breathing.  Patient states she was seen several days ago with similar complaints and other worsening including him a right ear pain, increased cough and back pain.  She has not taken any of the antibiotics since the pharmacy did not have it ready that which she was prescribed last time.  States when she is coughing her whole body hurts.  Past Medical History:  Diagnosis Date  . Asthma   . Bursitis     There are no active problems to display for this patient.   Past Surgical History:  Procedure Laterality Date  . KNEE ARTHROSCOPY Left   . OOPHORECTOMY Left   . right wristy surgery  2012    Allergies Patient has no known allergies.  Social History Social History   Tobacco Use  . Smoking status: Former Games developermoker  . Smokeless tobacco: Never Used  Substance Use Topics  . Alcohol use: No  . Drug use: No    Review of Systems Constitutional: Negative for fever. Eyes: Negative for vision changes ENT: Positive for congestion, earache Cardiovascular: Negative for chest pain. Respiratory: Positive for shortness of breath and cough Gastrointestinal: Negative for abdominal pain, vomiting and diarrhea. Genitourinary: Negative for dysuria. Musculoskeletal: Positive for back pain Skin: Negative for rash. Neurological: Negative for headaches, focal weakness or numbness.  All systems negative/normal/unremarkable except as stated in the HPI  ____________________________________________   PHYSICAL EXAM:  VITAL SIGNS: ED Triage Vitals [11/06/17 1921]   Enc Vitals Group     BP (!) 143/86     Pulse Rate 71     Resp (!) 22     Temp 97.6 F (36.4 C)     Temp Source Oral     SpO2 99 %     Weight 260 lb (117.9 kg)     Height 5\' 2"  (1.575 m)     Head Circumference      Peak Flow      Pain Score      Pain Loc      Pain Edu?      Excl. in GC?     Constitutional: Alert and oriented.  Anxious and tachypneic, mild distress Eyes: Conjunctivae are normal. Normal extraocular movements. ENT   Head: Normocephalic and atraumatic.   Nose: No congestion/rhinnorhea.   Mouth/Throat: Mucous membranes are moist.   Neck: No stridor. Cardiovascular: Normal rate, regular rhythm. No murmurs, rubs, or gallops. Respiratory: Tachypnea with mild expiratory wheezing Gastrointestinal: Soft and nontender. Normal bowel sounds Musculoskeletal: Nontender with normal range of motion in extremities. No lower extremity tenderness nor edema. Neurologic:  Normal speech and language. No gross focal neurologic deficits are appreciated.  Skin:  Skin is warm, dry and intact. No rash noted. Psychiatric: Mood and affect are normal. Speech and behavior are normal.  ____________________________________________  EKG: Interpreted by me.  Sinus rhythm rate 64 bpm, normal PR interval, normal QRS, normal QT.  ____________________________________________  ED COURSE:  As part of my medical decision making, I reviewed the following data within the electronic MEDICAL RECORD NUMBER History obtained from family if available, nursing notes, old chart and ekg,  as well as notes from prior ED visits. Patient presented for dyspnea and cough, we will assess with labs and imaging as indicated at this time.   Procedures ____________________________________________  RADIOLOGY Images were viewed by me  Chest x-ray is normal  ____________________________________________  DIFFERENTIAL DIAGNOSIS   Asthma, bronchitis, URI, panic attack, PE, pneumothorax  FINAL ASSESSMENT  AND PLAN  Cough, dyspnea   Plan: Patient had presented for cough and shortness of breath.  Patient's imaging was negative for any acute process.  She was given steroids as well as test next year for cough and Valium for anxiety.  Overall she is stable for outpatient follow-up.   Emily FilbertWilliams, Jonathan E, MD   Note: This note was generated in part or whole with voice recognition software. Voice recognition is usually quite accurate but there are transcription errors that can and very often do occur. I apologize for any typographical errors that were not detected and corrected.     Emily FilbertWilliams, Jonathan E, MD 11/06/17 2059

## 2017-11-06 NOTE — ED Triage Notes (Signed)
Pt was here on 12/189 with similar complaints and states now they are worsened including Right ear pain, increased coughing and back pain. Pt has not taken any of zpack since the pharmacy didn't have it ready

## 2017-11-06 NOTE — ED Notes (Signed)
Pt verbalizes d/c understanding and rx. Pt in NAD at time of d/c, VS stable. Pt denies any further concerns for this ED visit. PT with spouse at time of d/c

## 2018-11-13 ENCOUNTER — Other Ambulatory Visit: Payer: Self-pay

## 2018-11-13 ENCOUNTER — Emergency Department: Payer: BLUE CROSS/BLUE SHIELD

## 2018-11-13 ENCOUNTER — Emergency Department
Admission: EM | Admit: 2018-11-13 | Discharge: 2018-11-13 | Disposition: A | Discharge status: Home / Self Care | Payer: BLUE CROSS/BLUE SHIELD | Attending: Emergency Medicine | Admitting: Emergency Medicine

## 2018-11-13 ENCOUNTER — Encounter: Payer: Self-pay | Admitting: Emergency Medicine

## 2018-11-13 DIAGNOSIS — Z7982 Long term (current) use of aspirin: Secondary | ICD-10-CM | POA: Insufficient documentation

## 2018-11-13 DIAGNOSIS — J45901 Unspecified asthma with (acute) exacerbation: Secondary | ICD-10-CM | POA: Diagnosis not present

## 2018-11-13 DIAGNOSIS — R05 Cough: Secondary | ICD-10-CM | POA: Diagnosis not present

## 2018-11-13 DIAGNOSIS — Z79899 Other long term (current) drug therapy: Secondary | ICD-10-CM | POA: Insufficient documentation

## 2018-11-13 DIAGNOSIS — J069 Acute upper respiratory infection, unspecified: Secondary | ICD-10-CM

## 2018-11-13 DIAGNOSIS — Z87891 Personal history of nicotine dependence: Secondary | ICD-10-CM | POA: Diagnosis not present

## 2018-11-13 DIAGNOSIS — R0602 Shortness of breath: Secondary | ICD-10-CM | POA: Diagnosis present

## 2018-11-13 DIAGNOSIS — B9789 Other viral agents as the cause of diseases classified elsewhere: Secondary | ICD-10-CM

## 2018-11-13 LAB — CBC WITH DIFFERENTIAL/PLATELET
Abs Immature Granulocytes: 0.07 10*3/uL (ref 0.00–0.07)
Basophils Absolute: 0.1 10*3/uL (ref 0.0–0.1)
Basophils Relative: 1 %
Eosinophils Absolute: 0.7 10*3/uL — ABNORMAL HIGH (ref 0.0–0.5)
Eosinophils Relative: 9 %
HCT: 40.2 % (ref 36.0–46.0)
HEMOGLOBIN: 13.1 g/dL (ref 12.0–15.0)
Immature Granulocytes: 1 %
Lymphocytes Relative: 37 %
Lymphs Abs: 2.6 10*3/uL (ref 0.7–4.0)
MCH: 28.2 pg (ref 26.0–34.0)
MCHC: 32.6 g/dL (ref 30.0–36.0)
MCV: 86.5 fL (ref 80.0–100.0)
MONO ABS: 0.9 10*3/uL (ref 0.1–1.0)
Monocytes Relative: 13 %
Neutro Abs: 2.7 10*3/uL (ref 1.7–7.7)
Neutrophils Relative %: 39 %
Platelets: 240 10*3/uL (ref 150–400)
RBC: 4.65 MIL/uL (ref 3.87–5.11)
RDW: 13.1 % (ref 11.5–15.5)
WBC: 7 10*3/uL (ref 4.0–10.5)
nRBC: 0 % (ref 0.0–0.2)

## 2018-11-13 LAB — COMPREHENSIVE METABOLIC PANEL
ALT: 31 U/L (ref 0–44)
AST: 26 U/L (ref 15–41)
Albumin: 3.9 g/dL (ref 3.5–5.0)
Alkaline Phosphatase: 66 U/L (ref 38–126)
Anion gap: 7 (ref 5–15)
BUN: 13 mg/dL (ref 6–20)
CO2: 25 mmol/L (ref 22–32)
Calcium: 9.3 mg/dL (ref 8.9–10.3)
Chloride: 105 mmol/L (ref 98–111)
Creatinine, Ser: 0.78 mg/dL (ref 0.44–1.00)
GFR calc Af Amer: >60 mL/min (ref >60)
Glucose, Bld: 121 mg/dL — ABNORMAL HIGH (ref 70–99)
Potassium: 3.9 mmol/L (ref 3.5–5.1)
Sodium: 137 mmol/L (ref 135–145)
Total Bilirubin: 0.7 mg/dL (ref 0.3–1.2)
Total Protein: 7.7 g/dL (ref 6.5–8.1)

## 2018-11-13 LAB — I-STAT BETA HCG BLOOD, ED (NOT ORDERABLE)
I-stat hCG, quantitative: <5 m[IU]/mL (ref <5)
I-stat hCG, quantitative: <5 m[IU]/mL (ref <5)

## 2018-11-13 LAB — TROPONIN I: Troponin I: <0.03 ng/mL (ref <0.03)

## 2018-11-13 LAB — CG4 I-STAT (LACTIC ACID): Lactic Acid, Venous: 1.9 mmol/L (ref 0.5–1.9)

## 2018-11-13 MED ORDER — METHYLPREDNISOLONE SODIUM SUCC 125 MG IJ SOLR
125.0000 mg | Freq: Once | INTRAMUSCULAR | Status: AC
  Administered 2018-11-13: 125 mg via INTRAVENOUS
  Filled 2018-11-13: qty 2

## 2018-11-13 MED ORDER — SODIUM CHLORIDE 0.9 % IV BOLUS
1000.0000 mL | Freq: Once | INTRAVENOUS | Status: AC
  Administered 2018-11-13: 1000 mL via INTRAVENOUS

## 2018-11-13 MED ORDER — IPRATROPIUM-ALBUTEROL 0.5-2.5 (3) MG/3ML IN SOLN
3.0000 mL | Freq: Once | RESPIRATORY_TRACT | Status: AC
  Administered 2018-11-13: 3 mL via RESPIRATORY_TRACT
  Filled 2018-11-13: qty 3

## 2018-11-13 MED ORDER — MAGNESIUM SULFATE 2 GM/50ML IV SOLN
2.0000 g | Freq: Once | INTRAVENOUS | Status: AC
  Administered 2018-11-13: 2 g via INTRAVENOUS
  Filled 2018-11-13: qty 50

## 2018-11-13 MED ORDER — PREDNISONE 20 MG PO TABS: 60.0000 mg | ORAL_TABLET | Freq: Every day | ORAL | 0 refills | Status: AC

## 2018-11-13 NOTE — ED Triage Notes (Signed)
Arrives with C/O SOB.  States has had a URI and being treated for bronchitis with Amoxicillin from Urgent care x 2 days.  States breathing became worse today.  Patient is hoarse.  Dyspnea at rest.  Skin warm and dry.

## 2018-11-13 NOTE — ED Provider Notes (Signed)
[facility] Emergency Department Provider Note  ____________________________________________  Time seen: Approximately 8:08 PM  I have reviewed the triage vital signs and the nursing notes.   HISTORY  Chief Complaint Shortness of Breath   HPI Haley Keller is a 52 y.o. female with a history of asthma who presents for evaluation of shortness of breath.  Patient reports that she has been sick with URI symptoms of cough and congestion for the entire month of December.  She went to urgent care 2 days ago.  She had a negative flu swab.  She was started on amoxicillin for bronchitis.  She was not put on steroids.  She reports that her symptoms are not improving.  She is complaining of shortness of breath and wheezing which has been constant and moderate in intensity.  Worse with minimal exertion.  She has had a dry cough.  She has had no fever or chills, no vomiting or diarrhea.  She reports chest tightness which she usually gets with her asthma exacerbations.  She is not a smoker.  No known sick contact exposure.  No prior history of PE or DVT, no leg pain or swelling, no hemoptysis, no exogenous hormones, no recent travel immobilization.  Past Medical History:  Diagnosis Date  . Asthma   . Bursitis     Past Surgical History:  Procedure Laterality Date  . KNEE ARTHROSCOPY Left   . OOPHORECTOMY Left   . right wristy surgery  2012    Prior to Admission medications   Medication Sig Start Date End Date Taking? Authorizing Provider  albuterol (PROVENTIL HFA;VENTOLIN HFA) 108 (90 Base) MCG/ACT inhaler Inhale 2 puffs into the lungs every 6 (six) hours as needed for wheezing or shortness of breath. 11/04/17   Menshew, Charlesetta Ivory, PA-C  aspirin EC 81 MG tablet Take 81 mg by mouth daily.    [provider]  beclomethasone (QVAR) 80 MCG/ACT inhaler Inhale 2 puffs into the lungs 2 (two) times daily.    [provider]  benzonatate (TESSALON  PERLES) 100 MG capsule Take 1 capsule (100 mg total) by mouth 3 (three) times daily as needed for cough (Take 1-2 per dose). 11/04/17   Menshew, Charlesetta Ivory, PA-C  chlorpheniramine-HYDROcodone (TUSSIONEX PENNKINETIC ER) 10-8 MG/5ML SUER Take 5 mLs by mouth 2 (two) times daily. 11/06/17   Emily Filbert, MD  cyclobenzaprine (FLEXERIL) 10 MG tablet Take 1 tablet (10 mg total) by mouth 3 (three) times daily as needed for muscle spasms. 09/06/17   Willy Eddy, MD  predniSONE (DELTASONE) 20 MG tablet Take 3 tablets (60 mg total) by mouth daily for 4 days. 11/13/18 11/17/18  Nita Sickle, MD    Allergies Patient has no known allergies.  FH Heart disease Mother    Colon cancer Other grandfather   Colon cancer Sister       Social History Social History   Tobacco Use  . Smoking status: Former Games developer  . Smokeless tobacco: Never Used  Substance Use Topics  . Alcohol use: No  . Drug use: No    Review of Systems  Constitutional: Negative for fever. Eyes: Negative for visual changes. ENT: Negative for sore throat. Neck: No neck pain  Cardiovascular: Negative for chest pain. Respiratory: + shortness of breath, cough, wheezing Gastrointestinal: Negative for abdominal pain, vomiting or diarrhea. Genitourinary: Negative for dysuria. Musculoskeletal: Negative for back pain. Skin: Negative for rash. Neurological: Negative for headaches, weakness or numbness. Psych: No SI or HI  ____________________________________________   PHYSICAL EXAM:  VITAL SIGNS: ED Triage Vitals  Enc Vitals Group     BP 11/13/18 1901 (!) 152/74     Pulse Rate 11/13/18 1901 73     Resp 11/13/18 1901 (!) 22     Temp 11/13/18 1901 98.3 F (36.8 C)     Temp Source 11/13/18 1901 Oral     SpO2 11/13/18 1901 94 %     Weight 11/13/18 1902 259 lb 14.8 oz (117.9 kg)     Height --      Head Circumference --      Peak Flow --      Pain Score 11/13/18 1901 5     Pain Loc --      Pain Edu?  --      Excl. in GC? --     Constitutional: Alert and oriented, mildly increased WOB.  HEENT:      Head: Normocephalic and atraumatic.         Eyes: Conjunctivae are normal. Sclera is non-icteric.       Mouth/Throat: Mucous membranes are moist.       Neck: Supple with no signs of meningismus. Cardiovascular: Regular rate and rhythm. No murmurs, gallops, or rubs. 2+ symmetrical distal pulses are present in all extremities. No JVD. Respiratory: Mildly includes work of breathing, tachypnea, no hypoxia, decreased air movement bilaterally with expiratory wheezes  Gastrointestinal: Soft, non tender, and non distended with positive bowel sounds. No rebound or guarding. Musculoskeletal: Nontender with normal range of motion in all extremities. No edema, cyanosis, or erythema of extremities. Neurologic: Normal speech and language. Face is symmetric. Moving all extremities. No gross focal neurologic deficits are appreciated. Skin: Skin is warm, dry and intact. No rash noted. Psychiatric: Mood and affect are normal. Speech and behavior are normal.  ____________________________________________   LABS (all labs ordered are listed, but only abnormal results are displayed)  Labs Reviewed  COMPREHENSIVE METABOLIC PANEL - Abnormal; Notable for the following components:      Result Value   Glucose, Bld 121 (*)    All other components within normal limits  CBC WITH DIFFERENTIAL/PLATELET - Abnormal; Notable for the following components:   Eosinophils Absolute 0.7 (*)    All other components within normal limits  TROPONIN I  URINALYSIS, COMPLETE (UACMP) WITH MICROSCOPIC  CG4 I-STAT (LACTIC ACID)  I-STAT BETA HCG BLOOD, ED (NOT ORDERABLE)   ____________________________________________  EKG ED ECG REPORT I, Nita Sicklearolina Vickey Ewbank, the attending physician, personally viewed and interpreted this ECG.  Normal sinus rhythm, rate of 81, normal intervals, normal axis, no ST elevations or depressions.  Normal  EKG. ____________________________________________  RADIOLOGY  I have personally reviewed the images performed during this visit and I agree with the Radiologist's read.   Interpretation by Radiologist:  Dg Chest 2 View  Result Date: 11/13/2018 CLINICAL DATA:  C/O SOB. States has had a URI and being treated for bronchitis with Amoxicillin from Urgent care x 2 days. States breathing became worse today. EXAM: CHEST - 2 VIEW COMPARISON:  11/06/2017 FINDINGS: Heart size is accentuated by shallow lung inflation and is UPPER limits normal. There are no focal consolidations or pleural effusions. No pulmonary edema. IMPRESSION: No evidence for acute cardiopulmonary abnormality. Electronically Signed   By: Norva PavlovElizabeth  Brown M.D.   On: 11/13/2018 19:54     ____________________________________________   PROCEDURES  Procedure(s) performed: None Procedures Critical Care performed:  None ____________________________________________   INITIAL IMPRESSION / ASSESSMENT AND PLAN / ED COURSE  52  y.o. female with a history of asthma who presents for evaluation of shortness of breath, wheezing, chest tightness, dry cough, and congestion x 1 month. Currently on Augmentin. Negative Flu 2 days ago. No signs of sepsis at this time. CXR negative for PNA or PTX or pulmonary edema. No leukocytosis, normal lactic. Presentation concerning for asthma exacerbation in the setting of viral URI. Will give duonebs, solumedrol, magnesium, and reassess  Clinical Course as of Nov 14 2031  Sat Nov 13, 2018  2031 Patient feels markedly improved after IV fluids, Solu-Medrol and duo nebs.  Will discharge home and add prednisone to her current regimen.  Recommended close follow-up with primary care doctor.  Discussed return precautions.   [CV]    Clinical Course User Index [CV] Don PerkingVeronese, [place], MD     As part of my medical decision making, I reviewed the following data within the electronic MEDICAL RECORD NUMBER Nursing  notes reviewed and incorporated, Labs reviewed , EKG interpreted , Old EKG reviewed, Old chart reviewed, Radiograph reviewed , Notes from prior ED visits and [place] Controlled Substance Database    Pertinent labs & imaging results that were available during my care of the patient were reviewed by me and considered in my medical decision making (see chart for details).    ____________________________________________   FINAL CLINICAL IMPRESSION(S) / ED DIAGNOSES  Final diagnoses:  Viral URI with cough  Exacerbation of asthma, unspecified asthma severity, unspecified whether persistent      NEW MEDICATIONS STARTED DURING THIS VISIT:  ED Discharge Orders         Ordered    predniSONE (DELTASONE) 20 MG tablet  Daily     11/13/18 2032           Note:  This document was prepared using Dragon voice recognition software and may include unintentional dictation errors.    Nita SickleVeronese, [place], MD 11/13/18 2032

## 2020-01-23 ENCOUNTER — Other Ambulatory Visit: Payer: Self-pay | Admitting: Family Medicine

## 2020-01-23 DIAGNOSIS — Z1231 Encounter for screening mammogram for malignant neoplasm of breast: Secondary | ICD-10-CM

## 2020-01-25 ENCOUNTER — Other Ambulatory Visit: Payer: Self-pay

## 2020-01-25 ENCOUNTER — Ambulatory Visit
Admission: RE | Admit: 2020-01-25 | Discharge: 2020-01-25 | Disposition: A | Discharge status: Home / Self Care | Payer: BC Managed Care – PPO | Source: Ambulatory Visit | Attending: Family Medicine | Admitting: Family Medicine

## 2020-01-25 DIAGNOSIS — Z1231 Encounter for screening mammogram for malignant neoplasm of breast: Secondary | ICD-10-CM | POA: Diagnosis present

## 2021-12-29 IMAGING — MG DIGITAL SCREENING BILAT W/ TOMO W/ CAD
8 series · 8 of 24 positions shown · non-contrast
Comparison: Previous exam(s).

ACR Breast Density Category a: The breast tissue is almost entirely
fatty.

CLINICAL DATA: Screening.

EXAM:
DIGITAL SCREENING BILATERAL MAMMOGRAM WITH TOMO AND CAD

[R MLO synth-2D]
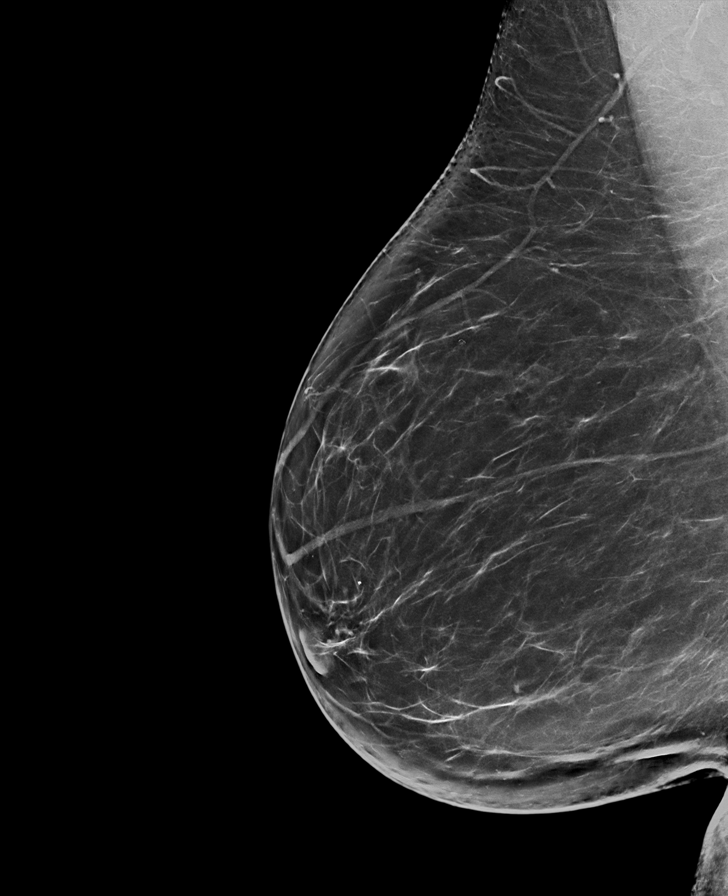

[L MLO synth-2D]
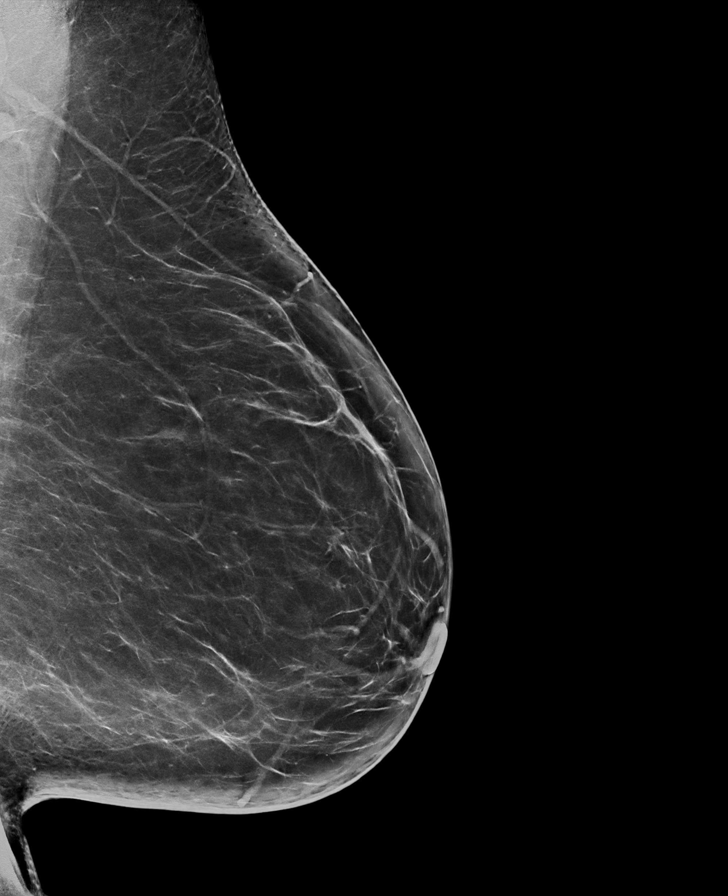

[L CC synth-2D]
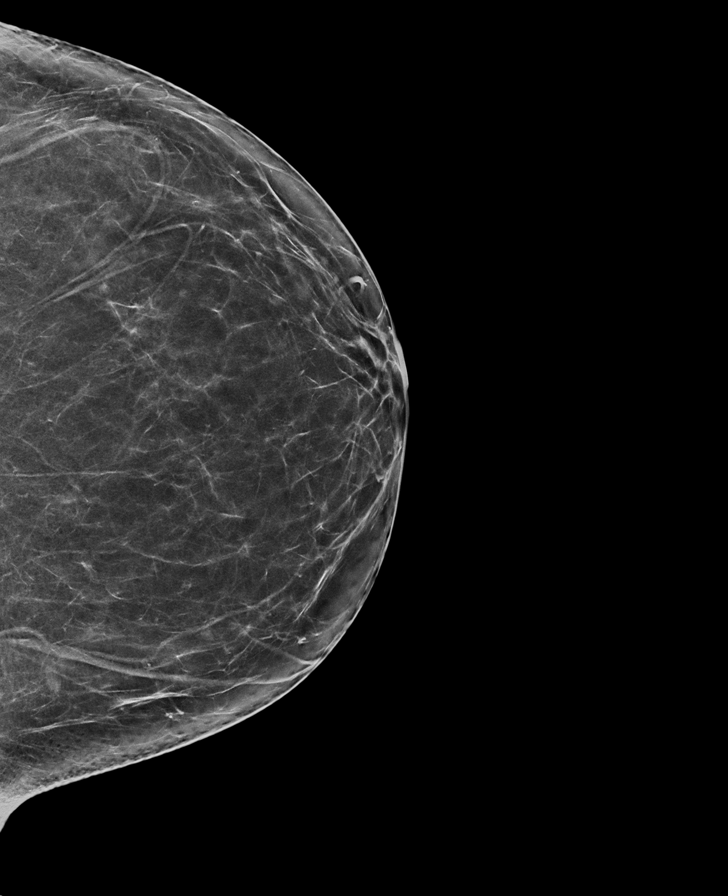

[R CC synth-2D]
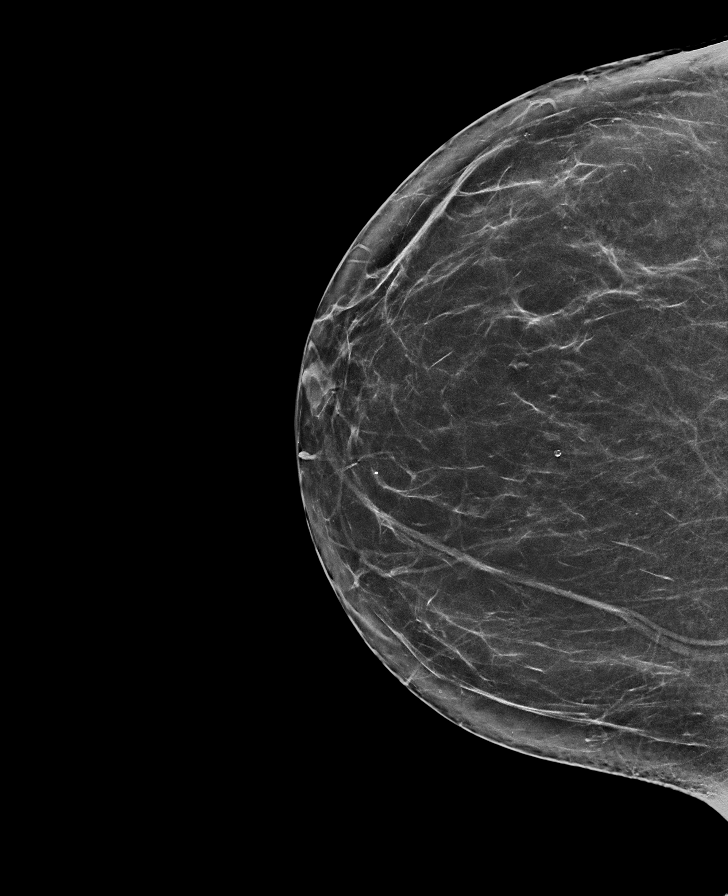

[R CC tomo · tomo slice 39/76.0]
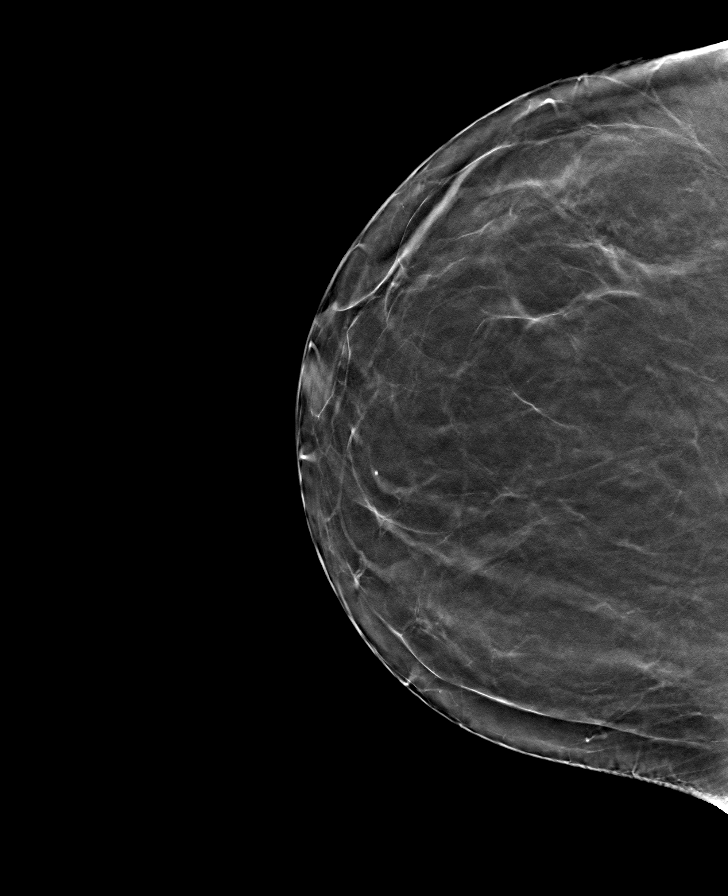

[L MLO tomo · tomo slice 45/89.0]
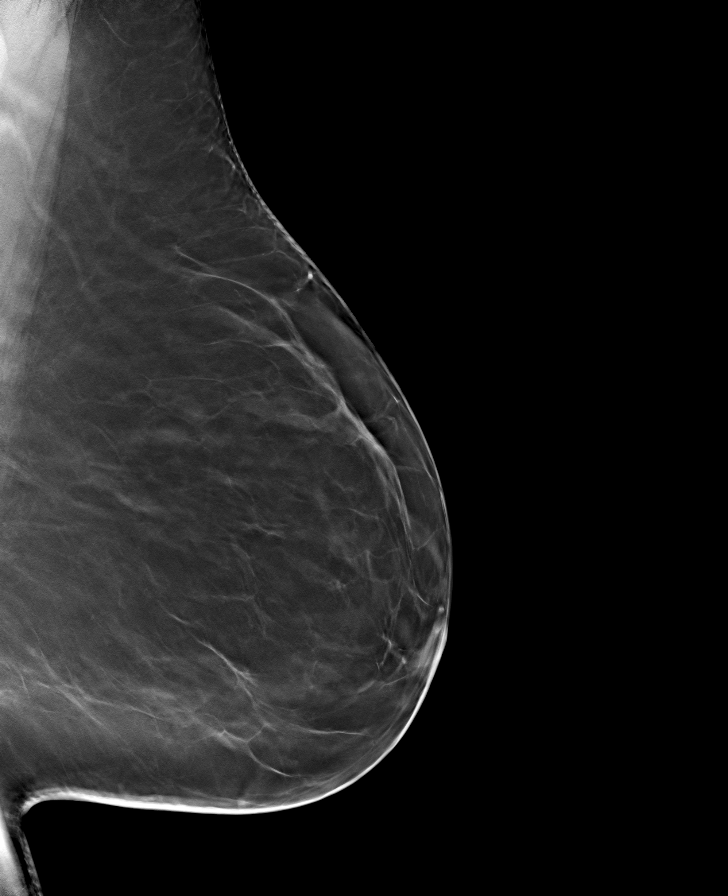

[L CC tomo · tomo slice 37/74.0]
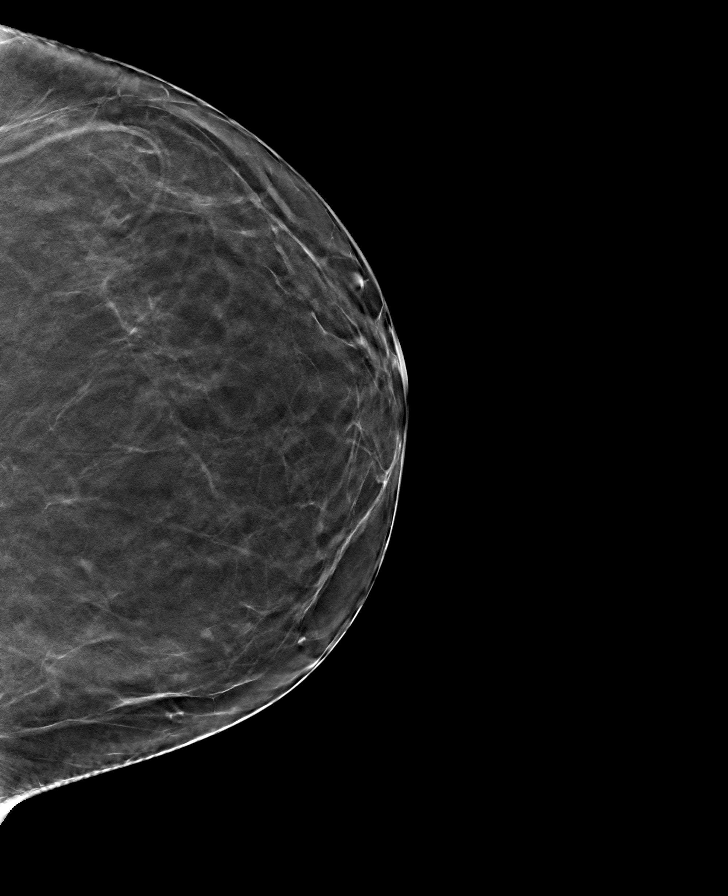

[R MLO tomo · tomo slice 45/88.0]
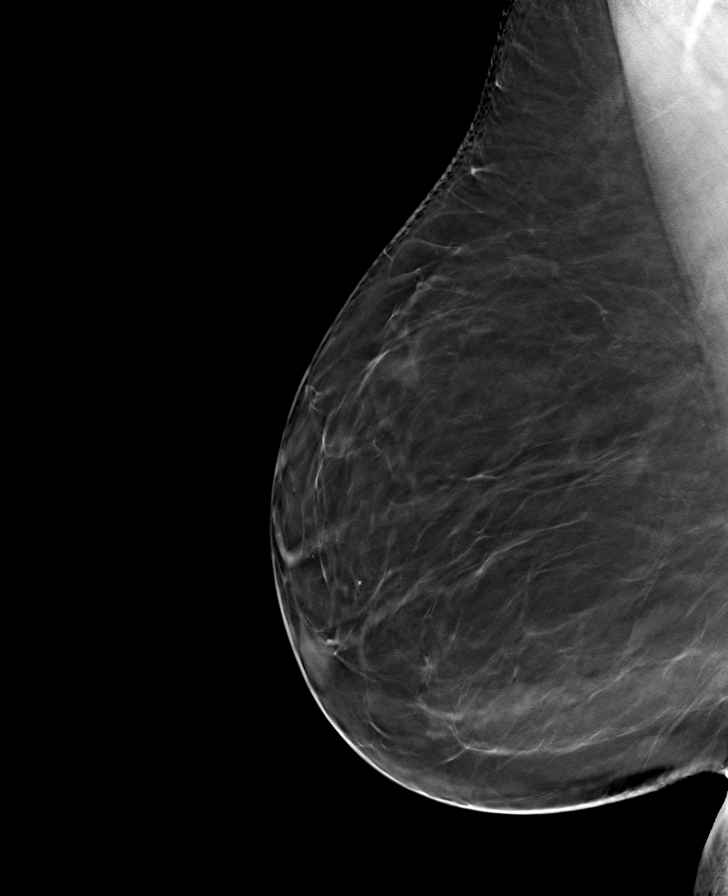

[8 of 24 positions shown; findings below may reference images not displayed]

FINDINGS: There are no findings suspicious for malignancy. Images were
processed with CAD.
IMPRESSION: No mammographic evidence of malignancy. A result letter of this
screening mammogram will be mailed directly to the patient.

RECOMMENDATION:
Screening mammogram in one year. (Code:[id number]-[id number])

BI-RADS CATEGORY  1: Negative.

## 2024-09-17 ENCOUNTER — Encounter: Payer: Self-pay | Admitting: Emergency Medicine

## 2024-09-17 ENCOUNTER — Emergency Department

## 2024-09-17 DIAGNOSIS — J45909 Unspecified asthma, uncomplicated: Secondary | ICD-10-CM | POA: Insufficient documentation

## 2024-09-17 DIAGNOSIS — E119 Type 2 diabetes mellitus without complications: Secondary | ICD-10-CM | POA: Diagnosis not present

## 2024-09-17 DIAGNOSIS — R0602 Shortness of breath: Secondary | ICD-10-CM | POA: Diagnosis not present

## 2024-09-17 DIAGNOSIS — R059 Cough, unspecified: Secondary | ICD-10-CM | POA: Diagnosis present

## 2024-09-17 DIAGNOSIS — I10 Essential (primary) hypertension: Secondary | ICD-10-CM | POA: Diagnosis not present

## 2024-09-17 DIAGNOSIS — R051 Acute cough: Secondary | ICD-10-CM | POA: Insufficient documentation

## 2024-09-17 LAB — COMPREHENSIVE METABOLIC PANEL WITH GFR
ALT: 26 U/L (ref 0–44)
AST: 32 U/L (ref 15–41)
Albumin: 3.7 g/dL (ref 3.5–5.0)
Alkaline Phosphatase: 65 U/L (ref 38–126)
Anion gap: 15 (ref 5–15)
BUN: 23 mg/dL — ABNORMAL HIGH (ref 6–20)
CO2: 21 mmol/L — ABNORMAL LOW (ref 22–32)
Calcium: 9.5 mg/dL (ref 8.9–10.3)
Chloride: 101 mmol/L (ref 98–111)
Creatinine, Ser: 1.05 mg/dL — ABNORMAL HIGH (ref 0.44–1.00)
GFR, Estimated: >60 mL/min (ref >60)
Glucose, Bld: 234 mg/dL — ABNORMAL HIGH (ref 70–99)
Potassium: 4.6 mmol/L (ref 3.5–5.1)
Sodium: 137 mmol/L (ref 135–145)
Total Bilirubin: 0.5 mg/dL (ref 0.0–1.2)
Total Protein: 8 g/dL (ref 6.5–8.1)

## 2024-09-17 LAB — CBC WITH DIFFERENTIAL/PLATELET
Abs Immature Granulocytes: 0.49 K/uL — ABNORMAL HIGH (ref 0.00–0.07)
Basophils Absolute: 0 K/uL (ref 0.0–0.1)
Basophils Relative: 0 %
Eosinophils Absolute: 0 K/uL (ref 0.0–0.5)
Eosinophils Relative: 0 %
HCT: 37.7 % (ref 36.0–46.0)
Hemoglobin: 12.5 g/dL (ref 12.0–15.0)
Immature Granulocytes: 2 %
Lymphocytes Relative: 10 %
Lymphs Abs: 2.1 K/uL (ref 0.7–4.0)
MCH: 29.1 pg (ref 26.0–34.0)
MCHC: 33.2 g/dL (ref 30.0–36.0)
MCV: 87.7 fL (ref 80.0–100.0)
Monocytes Absolute: 1 K/uL (ref 0.1–1.0)
Monocytes Relative: 5 %
Neutro Abs: 16.5 K/uL — ABNORMAL HIGH (ref 1.7–7.7)
Neutrophils Relative %: 83 %
Platelets: 293 K/uL (ref 150–400)
RBC: 4.3 MIL/uL (ref 3.87–5.11)
RDW: 12.7 % (ref 11.5–15.5)
WBC: 20.1 K/uL — ABNORMAL HIGH (ref 4.0–10.5)
nRBC: 0 % (ref 0.0–0.2)

## 2024-09-17 LAB — RESP PANEL BY RT-PCR (RSV, FLU A&B, COVID)  RVPGX2
Influenza A by PCR: NEGATIVE
Influenza B by PCR: NEGATIVE
Resp Syncytial Virus by PCR: NEGATIVE
SARS Coronavirus 2 by RT PCR: NEGATIVE

## 2024-09-17 LAB — TROPONIN I (HIGH SENSITIVITY): Troponin I (High Sensitivity): 4 ng/L (ref <18)

## 2024-09-17 NOTE — ED Triage Notes (Signed)
 Pt been having cough for 1 week. Reports some SOB and chest pain. Cough is productive.

## 2024-09-18 ENCOUNTER — Emergency Department: Admission: EM | Admit: 2024-09-18 | Discharge: 2024-09-18 | Disposition: A

## 2024-09-18 ENCOUNTER — Emergency Department

## 2024-09-18 DIAGNOSIS — R0602 Shortness of breath: Secondary | ICD-10-CM

## 2024-09-18 DIAGNOSIS — R051 Acute cough: Secondary | ICD-10-CM

## 2024-09-18 HISTORY — DX: Type 2 diabetes mellitus without complications: E11.9

## 2024-09-18 LAB — PROCALCITONIN: Procalcitonin: <0.1 ng/mL

## 2024-09-18 LAB — TROPONIN I (HIGH SENSITIVITY): Troponin I (High Sensitivity): 5 ng/L (ref <18)

## 2024-09-18 MED ORDER — IOHEXOL 300 MG/ML  SOLN
75.0000 mL | Freq: Once | INTRAMUSCULAR | Status: AC | PRN
  Administered 2024-09-18: 75 mL via INTRAVENOUS

## 2024-09-18 MED ORDER — IPRATROPIUM-ALBUTEROL 0.5-2.5 (3) MG/3ML IN SOLN
3.0000 mL | Freq: Once | RESPIRATORY_TRACT | Status: AC
  Administered 2024-09-18: 3 mL via RESPIRATORY_TRACT
  Filled 2024-09-18: qty 3

## 2024-09-18 MED ORDER — KETOROLAC TROMETHAMINE 15 MG/ML IJ SOLN
15.0000 mg | Freq: Once | INTRAMUSCULAR | Status: AC
  Administered 2024-09-18: 15 mg via INTRAVENOUS
  Filled 2024-09-18: qty 1

## 2024-09-18 MED ORDER — SODIUM CHLORIDE 0.9 % IV BOLUS
1000.0000 mL | Freq: Once | INTRAVENOUS | Status: AC
  Administered 2024-09-18: 1000 mL via INTRAVENOUS

## 2024-09-18 NOTE — Discharge Instructions (Signed)
 You was seen in the emergency department with 1 week of cough and intermittent shortness of breath.  Workup today was reassuring.  I would continue the medications started by your primary care physician.  Please give yourself a breathing treatment at least every 4 hours for the next 48 hours.  Please return with any acutely worsening symptoms. -- RETURN PRECAUTIONS & AFTERCARE: (ENGLISH) RETURN PRECAUTIONS: Return immediately to the emergency department or see/call your doctor if you feel worse, weak or have changes in speech or vision, are short of breath, have fever, vomiting, pain, bleeding or dark stool, trouble urinating or any new issues. Return here or see/call your doctor if not improving as expected for your suspected condition. FOLLOW-UP CARE: Call your doctor and/or any doctors we referred you to for more advice and to make an appointment. Do this today, tomorrow or after the weekend. Some doctors only take PPO insurance so if you have HMO insurance you may want to contact your HMO or your regular doctor for referral to a specialist within your plan. Either way tell the doctor's office that it was a referral from the emergency department so you get the soonest possible appointment.  YOUR TEST RESULTS: Take result reports of any blood or urine tests, imaging tests and EKG's to your doctor and any referral doctor. Have any abnormal tests repeated. Your doctor or a referral doctor can let you know when this should be done. Also make sure your doctor contacts this hospital to get any test results that are not currently available such as cultures or special tests for infection and final imaging reports, which are often not available at the time you leave the ER but which may list additional important findings that are not documented on the preliminary report. BLOOD PRESSURE: If your blood pressure was greater than 120/80 have your blood pressure rechecked within 1 to 2 weeks. MEDICATION SIDE EFFECTS: Do  not drive, walk, bike, take the bus, etc. if you have received or are being prescribed any sedating medications such as those for pain or anxiety or certain antihistamines like Benadryl. If you have been give one of these here get a taxi home or have a friend drive you home. Ask your pharmacist to counsel you on potential side effects of any new medication

## 2024-09-18 NOTE — ED Notes (Signed)
 Pt discharged to home.  AVS reviewed and pt verbalized understanding.  Pt has no questions at this time.

## 2024-09-18 NOTE — ED Provider Notes (Signed)
 [facility] Provider Note    Event Date/Time   First MD Initiated Contact with Patient 09/18/24 0017     (approximate)   History   Cough and Shortness of Breath   HPI  Haley Keller is a 58 y.o. female asthma, morbid obesity, type 2 diabetes, hypertension who presents with 1 week of progressively worsening shortness of breath and productive cough.  Patient was seen by her primary care physician earlier this week and started on prednisone , doxycycline and inhalers.  She reports that the cough keeps her awake at night.  She reports compliance with the medications given to her.  Denies any chest pain abdominal pain changes in urinary or bowel habits.  She presents with her husband who contributes to the history      Physical Exam   Triage Vital Signs: ED Triage Vitals [09/17/24 2055]  Encounter Vitals Group     BP (!) 173/41     Girls Systolic BP Percentile      Girls Diastolic BP Percentile      Boys Systolic BP Percentile      Boys Diastolic BP Percentile      Pulse Rate 79     Resp (!) 23     Temp 98.3 F (36.8 C)     Temp Source Oral     SpO2 98 %     Weight      Height      Head Circumference      Peak Flow      Pain Score      Pain Loc      Pain Education      Exclude from Growth Chart     Most recent vital signs: Vitals:   09/18/24 0041 09/18/24 0243  BP: (!) 115/47 117/66  Pulse: (!) 54 67  Resp: 20 20  Temp: 97.8 F (36.6 C) 97.7 F (36.5 C)  SpO2: 100% 100%    Nursing Triage Note reviewed. Vital signs reviewed and patients oxygen saturation is normoxic  General: Patient is well nourished, well developed, awake and alert, resting comfortably in no acute distress Head: Normocephalic and atraumatic Eyes: Normal inspection, extraocular muscles intact, no conjunctival pallor Ear, nose, throat: Normal external exam Neck: Normal range of motion Respiratory: Patient is in no respiratory distress, lungs mild  wheezes Cardiovascular: Patient is not tachycardic, RRR without murmur appreciated GI: Abd SNT with no guarding or rebound  Back: Normal inspection of the back with good strength and range of motion throughout all ext Extremities: pulses intact with good cap refills, no LE pitting edema or calf tenderness Neuro: The patient is alert and oriented to person, place, and time, appropriately conversive, with 5/5 bilat UE/LE strength, no gross motor or sensory defects noted. Coordination appears to be adequate. Skin: Warm, dry, and intact Psych: normal mood and affect, no SI or HI  ED Results / Procedures / Treatments   Labs (all labs ordered are listed, but only abnormal results are displayed) Labs Reviewed  COMPREHENSIVE METABOLIC PANEL WITH GFR - Abnormal; Notable for the following components:      Result Value   CO2 21 (*)    Glucose, Bld 234 (*)    BUN 23 (*)    Creatinine, Ser 1.05 (*)    All other components within normal limits  CBC WITH DIFFERENTIAL/PLATELET - Abnormal; Notable for the following components:   WBC 20.1 (*)    Neutro Abs 16.5 (*)    Abs Immature Granulocytes  0.49 (*)    All other components within normal limits  RESP PANEL BY RT-PCR (RSV, FLU A&B, COVID)  [id number]  PROCALCITONIN  TROPONIN I (HIGH SENSITIVITY)  TROPONIN I (HIGH SENSITIVITY)     EKG EKG and rhythm strip are interpreted by myself:   EKG: [Normal sinus rhythm] at heart rate of 73, normal QRS duration, QTc 434, normal ST segments and T waves no ectopy EKG not consistent with Acute STEMI Rhythm strip: NSR in lead II   RADIOLOGY CXR: Acute abnormality on my independent review interpretation radiologist agrees CT chest with contrast: No acute abnormality    PROCEDURES:  Critical Care performed: No  Procedures   MEDICATIONS ORDERED IN ED: Medications  ipratropium-albuterol  (DUONEB) 0.5-2.5 (3) MG/3ML nebulizer solution 3 mL (3 mLs Nebulization Given 09/18/24 0052)  ipratropium-albuterol   (DUONEB) 0.5-2.5 (3) MG/3ML nebulizer solution 3 mL (3 mLs Nebulization Given 09/18/24 0051)  sodium chloride  0.9 % bolus 1,000 mL (0 mLs Intravenous Stopped 09/18/24 0248)  ketorolac  (TORADOL ) 15 MG/ML injection 15 mg (15 mg Intravenous Given 09/18/24 0051)  iohexol (OMNIPAQUE) 300 MG/ML solution 75 mL (75 mLs Intravenous Contrast Given 09/18/24 0150)     IMPRESSION / MDM / ASSESSMENT AND PLAN / ED COURSE                                Differential diagnosis includes, but is not limited to, asthma exacerbation, bronchitis, occult pneumonia, cyst CHF, anemia, URI   ED course: Patient is well-appearing and satting 100% on room air.  She does have a leukocytosis which may be reactive from the prednisone  that she is currently taking.  Chest x-ray was unremarkable but given patient's continued symptoms CT chest was ordered which demonstrated no acute abnormality.  She did not have an elevated troponin.  She received 2 DuoNebs with complete improvement in her symptoms.  She feels comfortable returning home and following up with her primary care physician.  She will continue the medications recently prescribed to her   Clinical Course as of 09/18/24 0632  Sun Sep 18, 2024  0021 WBC(!): 20.1 Elevated [HD]  0021 Anion gap: 15 No anion gap [HD]  0051 Troponin I (High Sensitivity): 4 [HD]  0051 Not elevated [HD]  0238 Patient feels improved, she is reassured that the CT scan of her chest was unremarkable.  Repeat pulmonary exam was completely benign.  She is satting 100% on room air and not tachycardic.  She will follow-up with her primary care physician [HD]  0238 WBC(!): 20.1 Patient is on steroids [HD]  0247 Procalcitonin: <0.10 [HD]  0248 Troponin I (High Sensitivity): 5 [HD]    Clinical Course User Index [HD] Nicholaus Rolland BRAVO, MD   At time of discharge there is no evidence of acute life, limb, vision, or fertility threat. Patient has stable vital signs, pain is well controlled, patient is  ambulatory and p.o. tolerant.  Discharge instructions were completed using the EPIC system. I would refer you to those at this time. All warnings prescriptions follow-up etc. were discussed in detail with the patient. Patient indicates understanding and is agreeable with this plan. All questions answered.  Patient is made aware that they may return to the emergency department for any worsening or new condition or for any other emergency.  -- Risk: 5 This patient has a high risk of morbidity due to further diagnostic testing or treatment. Rationale: This patient's evaluation and management involve a high  risk of morbidity due to the potential severity of presenting symptoms, need for diagnostic testing, and/or initiation of treatment that may require close monitoring. The differential includes conditions with potential for significant deterioration or requiring escalation of care. Treatment decisions in the ED, including medication administration, procedural interventions, or disposition planning, reflect this level of risk. COPA: 5 The patient has the following acute or chronic illness/injury that poses a possible threat to life or bodily function: [X] : The patient has a potentially serious acute condition or an acute exacerbation of a chronic illness requiring urgent evaluation and management in the Emergency Department. The clinical presentation necessitates immediate consideration of life-threatening or function-threatening diagnoses, even if they are ultimately ruled out.   FINAL CLINICAL IMPRESSION(S) / ED DIAGNOSES   Final diagnoses:  Acute cough  Shortness of breath     Rx / DC Orders   ED Discharge Orders     None        Note:  This document was prepared using Dragon voice recognition software and may include unintentional dictation errors.   Nicholaus Rolland BRAVO, MD 09/18/24 [phone]
